# Patient Record
Sex: Female | Born: 1990 | Race: White | Hispanic: Yes | Marital: Single | Smoking: Never smoker
Health system: Southern US, Community
[De-identification: ages and names within clinical notes are randomized; demographics above are authoritative.]

## PROBLEM LIST (undated history)

## (undated) DIAGNOSIS — F419 Anxiety disorder, unspecified: Secondary | ICD-10-CM

## (undated) DIAGNOSIS — D649 Anemia, unspecified: Secondary | ICD-10-CM

## (undated) DIAGNOSIS — E785 Hyperlipidemia, unspecified: Secondary | ICD-10-CM

## (undated) HISTORY — DX: Anxiety disorder, unspecified: F41.9

## (undated) HISTORY — DX: Hyperlipidemia, unspecified: E78.5

## (undated) HISTORY — DX: Anemia, unspecified: D64.9

## (undated) HISTORY — PX: NO PAST SURGERIES: SHX2092

---

## 2018-07-03 LAB — HIV ANTIBODY (ROUTINE TESTING W REFLEX): HIV 1&2 Ab, 4th Generation: NONREACTIVE

## 2018-07-09 LAB — HM PAP SMEAR

## 2019-12-09 ENCOUNTER — Encounter: Payer: Self-pay | Admitting: Family Medicine

## 2019-12-09 ENCOUNTER — Other Ambulatory Visit: Payer: Self-pay

## 2019-12-09 ENCOUNTER — Ambulatory Visit (INDEPENDENT_AMBULATORY_CARE_PROVIDER_SITE_OTHER): Payer: 59 | Admitting: Family Medicine

## 2019-12-09 VITALS — BP 106/58 | HR 92 | Ht 63.75 in | Wt 152.4 lb

## 2019-12-09 DIAGNOSIS — R5383 Other fatigue: Secondary | ICD-10-CM | POA: Diagnosis not present

## 2019-12-09 DIAGNOSIS — F419 Anxiety disorder, unspecified: Secondary | ICD-10-CM

## 2019-12-09 DIAGNOSIS — E7800 Pure hypercholesterolemia, unspecified: Secondary | ICD-10-CM

## 2019-12-09 DIAGNOSIS — K5900 Constipation, unspecified: Secondary | ICD-10-CM

## 2019-12-09 DIAGNOSIS — D649 Anemia, unspecified: Secondary | ICD-10-CM | POA: Diagnosis not present

## 2019-12-09 DIAGNOSIS — I341 Nonrheumatic mitral (valve) prolapse: Secondary | ICD-10-CM

## 2019-12-09 DIAGNOSIS — Z7689 Persons encountering health services in other specified circumstances: Secondary | ICD-10-CM | POA: Insufficient documentation

## 2019-12-09 DIAGNOSIS — K649 Unspecified hemorrhoids: Secondary | ICD-10-CM | POA: Insufficient documentation

## 2019-12-09 NOTE — Progress Notes (Signed)
Subjective:    Patient ID: Wendy Mora, female    DOB: [birth year], 29 y.o.   MRN: [id number]  Wendy Mora is a 29 y.o. female presenting on 12/09/2019 for Establish Care and Fatigue   HPI  Wendy Mora presents to clinic as a new patient to establish for primary care.  Previous PCP was at [facility] in [place], [place].  Records will be requested.  Past medical, family, and surgical history reviewed w/ pt.  Wendy Mora has acute concerns for referral to gastroenterology for hemorrhoids and cardiology to establish for her history of MVP.  Has concerns for fatigue that has been present for approximately 3 weeks, was diagnosed with anemia approximately 5 months ago and took an iron supplement for approximately 30 days.  Has not had repeat labs drawn but reports has been working towards increasing her dietary iron intake.  No flowsheet data found.  Social History   Tobacco Use  . Smoking status: Former Smoker    Years: 0.50    Quit date: 06/08/2019    Years since quitting: 0.5  . Smokeless tobacco: Never Used  . Tobacco comment: social smoker   Vaping Use  . Vaping Use: Never used  Substance Use Topics  . Alcohol use: Yes    Comment: socially  . Drug use: Never    Review of Systems  Constitutional: Positive for fatigue. Negative for activity change, appetite change, chills, diaphoresis, fever and unexpected weight change.  HENT: Negative.   Eyes: Negative.   Respiratory: Negative.   Cardiovascular: Negative.   Gastrointestinal: Positive for constipation. Negative for abdominal distention, abdominal pain, diarrhea, nausea, rectal pain and vomiting.       Blood on toilet tissue at times  Endocrine: Negative.   Genitourinary: Negative.   Musculoskeletal: Negative.   Skin: Negative.   Allergic/Immunologic: Negative.   Neurological: Negative.   Hematological: Negative.   Psychiatric/Behavioral: Negative.    Per HPI unless specifically indicated  above     Objective:    BP (!) 106/58 (BP Location: Right Arm, Patient Position: Sitting, Cuff Size: Normal)   Pulse 92   Ht 5' 3.75" (1.619 m)   Wt 152 lb 6.4 oz (69.1 kg)   LMP 11/19/2019   BMI 26.36 kg/m   Wt Readings from Last 3 Encounters:  12/09/19 152 lb 6.4 oz (69.1 kg)    Physical Exam Vitals and nursing note reviewed.  Constitutional:      General: She is not in acute distress.    Appearance: Normal appearance. She is well-developed, well-groomed and overweight. She is not ill-appearing or toxic-appearing.  HENT:     Head: Normocephalic and atraumatic.     Nose:     Comments: Lesia Sago is in place, covering mouth and nose. Eyes:     General: Lids are normal. Vision grossly intact.        Right eye: No discharge.        Left eye: No discharge.     Extraocular Movements: Extraocular movements intact.     Conjunctiva/sclera: Conjunctivae normal.     Pupils: Pupils are equal, round, and reactive to light.  Cardiovascular:     Rate and Rhythm: Normal rate and regular rhythm.     Pulses: Normal pulses.     Heart sounds: Normal heart sounds. No murmur heard.  No friction rub. No gallop.   Pulmonary:     Effort: Pulmonary effort is normal. No respiratory distress.     Breath sounds: Normal breath sounds.  Skin:    General: Skin is warm and dry.     Capillary Refill: Capillary refill takes less than 2 seconds.  Neurological:     General: No focal deficit present.     Mental Status: She is alert and oriented to person, place, and time.  Psychiatric:        Attention and Perception: Attention and perception normal.        Mood and Affect: Mood and affect normal.        Speech: Speech normal.        Behavior: Behavior normal. Behavior is cooperative.        Thought Content: Thought content normal.        Cognition and Memory: Cognition and memory normal.        Judgment: Judgment normal.    No results found for this or any previous visit.    Assessment & Plan:    Problem List Items Addressed This Visit      Cardiovascular and Mediastinum   Mitral valve prolapse    Reports previously managed with cardiology in [place], [place], records not available but are requested at today's visit.  States was previously prescribed a beta-blocker but has not taken this in the past, has had a stress test and echo, reports not available at time of visit.  Reports father passed suddenly in his sleep 06/2019, believes to be cardiac in origin.  Requesting referral to cardiology to establish locally.  Referral to cardiology placed.      Relevant Orders   Ambulatory referral to Cardiology   Hemorrhoids    Hx of hemorrhoids with constipation and straining leading to blood on toilet tissue.  Reports previous PCP in past had referred her to GI in [place] but moved prior to completing that appointment.  Reports no improvement with increase in water intake or fiber intake.  Is interested in referral to local GI provider to establish.  Referral placed to GI.      Relevant Orders   Ambulatory referral to Gastroenterology     Other   Anemia    Status unknown.  Recheck labs.  Followup after labs.       Relevant Orders   CBC with Differential   Fatigue    Hx of anemia, last labs unknown.  With hemorrhoids and blood on toilet tissue.  Will have repeat labs drawn for evaluation of H/H and for TSH.  Labs to be drawn in the next 1-2 weeks.      Relevant Orders   CBC with Differential   COMPLETE METABOLIC PANEL WITH GFR   TSH + free T4   Anxiety    Reports history of social anxiety, well controlled at the present moment.  No acute concerns.      Encounter to establish care with new doctor - Primary    New patient establishment at [facility] for primary care.  Previous PCP with [facility] in [place], [place].  Records to be requested.  Will schedule for CPE in 3-6 months.      Constipation    See hemorrhoids A/P      Relevant Orders   Ambulatory  referral to Gastroenterology   Elevated LDL cholesterol level    Reports history of elevated LDL in the past.  Has been working on dietary and lifestyle modifications.  No labs for review, previous medical records have been requested.         No orders of the defined types were placed in this  encounter.  Follow up plan: Return in about 6 months (around 06/07/2020) for CPE.   Charlaine Dalton, FNP Family Nurse Practitioner [facility] 12/09/2019, 3:23 PM

## 2019-12-09 NOTE — Assessment & Plan Note (Addendum)
Hx of hemorrhoids with constipation and straining leading to blood on toilet tissue.  Reports previous PCP in past had referred her to GI in [place] but moved prior to completing that appointment.  Reports no improvement with increase in water intake or fiber intake.  Is interested in referral to local GI provider to establish.  Referral placed to GI.

## 2019-12-09 NOTE — Assessment & Plan Note (Signed)
Reports previously managed with cardiology in [place], [place], records not available but are requested at today's visit.  States was previously prescribed a beta-blocker but has not taken this in the past, has had a stress test and echo, reports not available at time of visit.  Reports father passed suddenly in his sleep 06/2019, believes to be cardiac in origin.  Requesting referral to cardiology to establish locally.  Referral to cardiology placed.

## 2019-12-09 NOTE — Assessment & Plan Note (Signed)
Hx of anemia, last labs unknown.  With hemorrhoids and blood on toilet tissue.  Will have repeat labs drawn for evaluation of H/H and for TSH.  Labs to be drawn in the next 1-2 weeks.

## 2019-12-09 NOTE — Assessment & Plan Note (Signed)
Reports history of social anxiety, well controlled at the present moment.  No acute concerns.

## 2019-12-09 NOTE — Assessment & Plan Note (Signed)
Status unknown.  Recheck labs.  Followup after labs.  

## 2019-12-09 NOTE — Patient Instructions (Signed)
Have your labs drawn and we will contact you with the results.  A referral to Cardiology has been placed today.  If you have not heard from the specialty office or our referral coordinator within 1 week, please let us know and we will follow up with the referral coordinator for an update.  A referral to Gastroenterology has been placed today.  If you have not heard from the specialty office or our referral coordinator within 1 week, please let us know and we will follow up with the referral coordinator for an update.  We will plan to see you back in 3-6 months for your physical  You will receive a survey after today's visit either digitally by e-mail or paper by USPS mail. Your experiences and feedback matter to [place].  Please respond so we know how we are doing as we provide care for you.  Call us with any questions/concerns/needs.  It is my goal to be available to you for your health concerns.  Thanks for choosing me to be a partner in your healthcare needs!  Charlaine Dalton, FNP-C Family Nurse Practitioner [facility] Phone: [phone]

## 2019-12-09 NOTE — Assessment & Plan Note (Signed)
See hemorrhoids A/P

## 2019-12-09 NOTE — Assessment & Plan Note (Signed)
New patient establishment at [facility] for primary care.  Previous PCP with [facility] in [place], [place].  Records to be requested.  Will schedule for CPE in 3-6 months.

## 2019-12-09 NOTE — Assessment & Plan Note (Signed)
Reports history of elevated LDL in the past.  Has been working on dietary and lifestyle modifications.  No labs for review, previous medical records have been requested.

## 2019-12-10 ENCOUNTER — Telehealth: Payer: Self-pay

## 2019-12-10 NOTE — Telephone Encounter (Signed)
Copied from CRM [id number]. Topic: Appointment Scheduling - Scheduling Inquiry for Clinic >> Dec 10, 2019  8:51 AM Leafy Ro wrote: Reason for CRM: Pt is calling and was seen yesterday and did not have blood work. Pt is calling to see if she needs to come back to office for blood work or have blood work drawn in 1-2 wks. Please confirm

## 2019-12-10 NOTE — Telephone Encounter (Signed)
In your note you recommended that the patient f/u in 1-2 weeks for labwork. Appt scheduled for Dec 13th. Please order labs for the patient.

## 2019-12-10 NOTE — Telephone Encounter (Signed)
Labs ordered during yesterday's appt.  Still in chart.  TY

## 2019-12-23 ENCOUNTER — Other Ambulatory Visit: Payer: Self-pay

## 2019-12-23 ENCOUNTER — Ambulatory Visit: Payer: 59 | Admitting: Cardiology

## 2019-12-23 ENCOUNTER — Other Ambulatory Visit: Payer: 59

## 2019-12-23 ENCOUNTER — Encounter: Payer: Self-pay | Admitting: Cardiology

## 2019-12-23 VITALS — BP 110/70 | HR 73 | Ht 65.5 in | Wt 148.1 lb

## 2019-12-23 DIAGNOSIS — E7800 Pure hypercholesterolemia, unspecified: Secondary | ICD-10-CM

## 2019-12-23 DIAGNOSIS — I341 Nonrheumatic mitral (valve) prolapse: Secondary | ICD-10-CM | POA: Diagnosis not present

## 2019-12-23 NOTE — Patient Instructions (Signed)
We will have you sign a release for your records (stress test, echo) from doctor in [place] if you haven't already.  Medication Instructions:  Your physician recommends that you continue on your current medications as directed. Please refer to the Current Medication list given to you today.  *If you need a refill on your cardiac medications before your next appointment, please call your pharmacy*  Lab Work: None  If you have labs (blood work) drawn today and your tests are completely normal, you will receive your results only by: Marland Kitchen MyChart Message (if you have MyChart) OR . A paper copy in the mail If you have any lab test that is abnormal or we need to change your treatment, we will call you to review the results.   Testing/Procedures: Your physician has requested that you have an echocardiogram. Echocardiography is a painless test that uses sound waves to create images of your heart. It provides your doctor with information about the size and shape of your heart and how well your heart's chambers and valves are working. This procedure takes approximately one hour. There are no restrictions for this procedure. There is a possibility that an IV may need to be started during your test to inject an image enhancing agent. This is done to obtain more optimal pictures of your heart. Therefore we ask that you do at least drink some water prior to coming in to hydrate your veins.    Follow-Up: At [facility], you and your health needs are our priority.  As part of our continuing mission to provide you with exceptional heart care, we have created designated Provider Care Teams.  These Care Teams include your primary Cardiologist (physician) and Advanced Practice Providers (APPs -  Physician Assistants and Nurse Practitioners) who all work together to provide you with the care you need, when you need it.  We recommend signing up for the patient portal called "MyChart".  Sign up information is  provided on this After Visit Summary.  MyChart is used to connect with patients for Virtual Visits (Telemedicine).  Patients are able to view lab/test results, encounter notes, upcoming appointments, etc.  Non-urgent messages can be sent to your provider as well.   To learn more about what you can do with MyChart, go to [email or web address].    Your next appointment:   After echo.   The format for your next appointment:   In Person  Provider:   You may see Dr. Azucena Cecil or one of the following Advanced Practice Providers on your designated Care Team:    Nicolasa Ducking, NP  Eula Listen, PA-C  Marisue Ivan, PA-C  Cadence Cordova, [place]  Gillian Shields, NP

## 2019-12-23 NOTE — Progress Notes (Signed)
Cardiology Office Note:    Date:  12/23/2019   ID:  Wendy Mora, DOB [birth year], MRN [id number]  PCP:  Tarri Fuller, FNP  [facility] Cardiologist:  No primary care provider on file.  [facility] Electrophysiologist:  None   Referring MD: Tarri Fuller, FNP   Chief Complaint  Patient presents with  . OTHER    Hx Mitral valve prolapse no complaints today. Meds reviewed verbally with pt.   Wendy Mora is a 29 y.o. female who is being seen today for the evaluation of mitral valve prolapse at the request of Malfi, Jodelle Gross, FNP.   History of Present Illness:    Wendy Mora is a 29 y.o. female with a hx of mitral valve prolapse who presents to establish care.  Patient states being diagnosed with mitral valve prolapse 3 years ago while in [place].  She had symptoms of palpitations, was placed on a cardiac monitor but did not wear it after developing a skin reaction.  She underwent stress testing which was normal.  Also had an echocardiogram and was told she had mitral valve prolapse.  Has not followed up since.  She recently moved to the area from [place].  Denies chest pain, shortness of breath, palpitations.  Denies any other history of heart disease.  Past Medical History:  Diagnosis Date  . Anemia   . Anxiety   . Hyperlipidemia   . Mitral valve prolapse     Past Surgical History:  Procedure Laterality Date  . NO PAST SURGERIES      Current Medications: Current Meds  Medication Sig  . Specialty Vitamins Products (COLLAGEN ULTRA) CAPS Take 1 capsule by mouth.     Allergies:   Mango flavor   Social History   Socioeconomic History  . Marital status: Single    Spouse name: Not on file  . Number of children: Not on file  . Years of education: Not on file  . Highest education level: Not on file  Occupational History  . Not on file  Tobacco Use  . Smoking status: Former Smoker    Years: 0.50    Quit date: 06/08/2019    Years since  quitting: 0.5  . Smokeless tobacco: Never Used  . Tobacco comment: social smoker   Vaping Use  . Vaping Use: Never used  Substance and Sexual Activity  . Alcohol use: Yes    Comment: socially  . Drug use: Never  . Sexual activity: Not on file  Other Topics Concern  . Not on file  Social History Narrative  . Not on file   Social Determinants of Health   Financial Resource Strain: Not on file  Food Insecurity: Not on file  Transportation Needs: Not on file  Physical Activity: Not on file  Stress: Not on file  Social Connections: Not on file     Family History: The patient's family history includes Alcohol abuse in her father; Alzheimer's disease in her paternal grandmother; Bipolar disorder in her brother; Cancer in her maternal grandfather and maternal grandmother; Depression in her mother; Diabetes in her brother and mother; Heart disease in her father; High Cholesterol in her sister; Hyperlipidemia in her mother; Hypertension in her mother; Mental retardation in her brother; Obesity in her brother; Thyroid disease in her mother.  ROS:   Please see the history of present illness.     All other systems reviewed and are negative.  EKGs/Labs/Other Studies Reviewed:    The following studies were reviewed  today:   EKG:  EKG is  ordered today.  The ekg ordered today demonstrates normal sinus rhythm, sinus arrhythmia, normal ECG.  Recent Labs: No results found for requested labs within last 8760 hours.  Recent Lipid Panel No results found for: CHOL, TRIG, HDL, CHOLHDL, VLDL, LDLCALC, LDLDIRECT   Risk Assessment/Calculations:      Physical Exam:    VS:  BP 110/70 (BP Location: Left Arm, Patient Position: Sitting, Cuff Size: Normal)   Pulse 73   Ht 5' 5.5" (1.664 m)   Wt 148 lb 2 oz (67.2 kg)   SpO2 99%   BMI 24.27 kg/m     Wt Readings from Last 3 Encounters:  12/23/19 148 lb 2 oz (67.2 kg)  12/09/19 152 lb 6.4 oz (69.1 kg)     GEN:  Well nourished, well  developed in no acute distress HEENT: Normal NECK: No JVD; No carotid bruits LYMPHATICS: No lymphadenopathy CARDIAC: RRR, no murmurs, rubs, gallops RESPIRATORY:  Clear to auscultation without rales, wheezing or rhonchi  ABDOMEN: Soft, non-tender, non-distended MUSCULOSKELETAL:  No edema; No deformity  SKIN: Warm and dry NEUROLOGIC:  Alert and oriented x 3 PSYCHIATRIC:  Normal affect   ASSESSMENT:    1. Mitral valve prolapse   2. Pure hypercholesterolemia    PLAN:    In order of problems listed above:  1. Patient with history of mitral valve prolapse.  Will get echocardiogram to confirm diagnosis and also evaluate for any significant valvular pathology including regurgitation.  Obtain records from prior cardiologist for comparison. 2. Hyperlipidemia, repeat blood work being planned by PCP.  Low-cholesterol diet, exercise advised.  Follow-up after echo.    Medication Adjustments/Labs and Tests Ordered: Current medicines are reviewed at length with the patient today.  Concerns regarding medicines are outlined above.  Orders Placed This Encounter  Procedures  . EKG 12-Lead  . ECHOCARDIOGRAM COMPLETE   No orders of the defined types were placed in this encounter.   Patient Instructions  We will have you sign a release for your records (stress test, echo) from doctor in [place] if you haven't already.  Medication Instructions:  Your physician recommends that you continue on your current medications as directed. Please refer to the Current Medication list given to you today.  *If you need a refill on your cardiac medications before your next appointment, please call your pharmacy*  Lab Work: None  If you have labs (blood work) drawn today and your tests are completely normal, you will receive your results only by: Marland Kitchen MyChart Message (if you have MyChart) OR . A paper copy in the mail If you have any lab test that is abnormal or we need to change your treatment, we will call  you to review the results.   Testing/Procedures: Your physician has requested that you have an echocardiogram. Echocardiography is a painless test that uses sound waves to create images of your heart. It provides your doctor with information about the size and shape of your heart and how well your heart's chambers and valves are working. This procedure takes approximately one hour. There are no restrictions for this procedure. There is a possibility that an IV may need to be started during your test to inject an image enhancing agent. This is done to obtain more optimal pictures of your heart. Therefore we ask that you do at least drink some water prior to coming in to hydrate your veins.    Follow-Up: At [facility], you and your health needs  are our priority.  As part of our continuing mission to provide you with exceptional heart care, we have created designated Provider Care Teams.  These Care Teams include your primary Cardiologist (physician) and Advanced Practice Providers (APPs -  Physician Assistants and Nurse Practitioners) who all work together to provide you with the care you need, when you need it.  We recommend signing up for the patient portal called "MyChart".  Sign up information is provided on this After Visit Summary.  MyChart is used to connect with patients for Virtual Visits (Telemedicine).  Patients are able to view lab/test results, encounter notes, upcoming appointments, etc.  Non-urgent messages can be sent to your provider as well.   To learn more about what you can do with MyChart, go to [email or web address].    Your next appointment:   After echo.   The format for your next appointment:   In Person  Provider:   You may see Dr. Azucena Cecil or one of the following Advanced Practice Providers on your designated Care Team:    Nicolasa Ducking, NP  Eula Listen, PA-C  Marisue Ivan, PA-C  Cadence Nada, [place]  Gillian Shields, NP      Signed, Debbe Odea, MD  12/23/2019 4:44 PM    [facility]

## 2019-12-24 LAB — COMPLETE METABOLIC PANEL WITH GFR
AG Ratio: 1.5 (calc) (ref 1.0–2.5)
ALT: 11 U/L (ref 6–29)
AST: 15 U/L (ref 10–30)
Albumin: 4.3 g/dL (ref 3.6–5.1)
Alkaline phosphatase (APISO): 52 U/L (ref 31–125)
BUN: 11 mg/dL (ref 7–25)
CO2: 25 mmol/L (ref 20–32)
Calcium: 9.3 mg/dL (ref 8.6–10.2)
Chloride: 101 mmol/L (ref 98–110)
Creat: 0.68 mg/dL (ref 0.50–1.10)
GFR, Est African American: 137 mL/min/{1.73_m2} (ref >=60)
GFR, Est Non African American: 118 mL/min/{1.73_m2} (ref >=60)
Globulin: 2.8 g/dL (calc) (ref 1.9–3.7)
Glucose, Bld: 84 mg/dL (ref 65–99)
Potassium: 4 mmol/L (ref 3.5–5.3)
Sodium: 135 mmol/L (ref 135–146)
Total Bilirubin: 1.2 mg/dL (ref 0.2–1.2)
Total Protein: 7.1 g/dL (ref 6.1–8.1)

## 2019-12-24 LAB — CBC WITH DIFFERENTIAL/PLATELET
Absolute Monocytes: 544 cells/uL (ref 200–950)
Basophils Absolute: 70 cells/uL (ref 0–200)
Basophils Relative: 1.1 %
Eosinophils Absolute: 90 cells/uL (ref 15–500)
Eosinophils Relative: 1.4 %
HCT: 33.7 % — ABNORMAL LOW (ref 35.0–45.0)
Hemoglobin: 11.3 g/dL — ABNORMAL LOW (ref 11.7–15.5)
Lymphs Abs: 2144 cells/uL (ref 850–3900)
MCH: 28.9 pg (ref 27.0–33.0)
MCHC: 33.5 g/dL (ref 32.0–36.0)
MCV: 86.2 fL (ref 80.0–100.0)
MPV: 11.2 fL (ref 7.5–12.5)
Monocytes Relative: 8.5 %
Neutro Abs: 3552 cells/uL (ref 1500–7800)
Neutrophils Relative %: 55.5 %
Platelets: 301 10*3/uL (ref 140–400)
RBC: 3.91 10*6/uL (ref 3.80–5.10)
RDW: 12.8 % (ref 11.0–15.0)
Total Lymphocyte: 33.5 %
WBC: 6.4 10*3/uL (ref 3.8–10.8)

## 2019-12-24 LAB — TSH+FREE T4: TSH W/REFLEX TO FT4: 1.63 mIU/L

## 2019-12-27 ENCOUNTER — Encounter: Payer: Self-pay | Admitting: Family Medicine

## 2020-01-01 ENCOUNTER — Other Ambulatory Visit: Payer: Self-pay | Admitting: Family Medicine

## 2020-01-06 ENCOUNTER — Encounter: Payer: Self-pay | Admitting: Family Medicine

## 2020-01-23 ENCOUNTER — Ambulatory Visit (INDEPENDENT_AMBULATORY_CARE_PROVIDER_SITE_OTHER): Payer: 59

## 2020-01-23 ENCOUNTER — Other Ambulatory Visit: Payer: Self-pay

## 2020-01-23 DIAGNOSIS — I341 Nonrheumatic mitral (valve) prolapse: Secondary | ICD-10-CM | POA: Diagnosis not present

## 2020-01-23 LAB — ECHOCARDIOGRAM COMPLETE
Area-P 1/2: 4.08 cm2
S' Lateral: 3.2 cm

## 2020-01-24 ENCOUNTER — Encounter: Payer: Self-pay | Admitting: Gastroenterology

## 2020-01-24 ENCOUNTER — Other Ambulatory Visit: Payer: Self-pay | Admitting: Gastroenterology

## 2020-01-24 ENCOUNTER — Other Ambulatory Visit: Payer: Self-pay

## 2020-01-24 ENCOUNTER — Ambulatory Visit: Payer: 59 | Admitting: Gastroenterology

## 2020-01-24 VITALS — BP 116/81 | HR 99 | Temp 97.8°F | Ht 65.5 in | Wt 148.5 lb

## 2020-01-24 DIAGNOSIS — K625 Hemorrhage of anus and rectum: Secondary | ICD-10-CM

## 2020-01-24 DIAGNOSIS — D649 Anemia, unspecified: Secondary | ICD-10-CM

## 2020-01-24 DIAGNOSIS — K602 Anal fissure, unspecified: Secondary | ICD-10-CM

## 2020-01-24 MED ORDER — NA SULFATE-K SULFATE-MG SULF 17.5-3.13-1.6 GM/177ML PO SOLN: 354.0000 mL | Freq: Once | ORAL | 0 refills | Status: DC

## 2020-01-24 MED ORDER — SUTAB 1479-225-188 MG PO TABS: 12.0000 | ORAL_TABLET | Freq: Two times a day (BID) | ORAL | 0 refills | Status: DC

## 2020-01-24 NOTE — Progress Notes (Signed)
Arlyss Repress, MD [address]  [address]  [place], [place] [address]  Main: [phone]  Fax: [phone]    Gastroenterology Consultation  Referring Provider:     Tarri Fuller, FNP Primary Care Physician:  Tarri Fuller, FNP Primary Gastroenterologist:  Dr. Arlyss Repress Reason for Consultation:     Rectal bleeding, rectal pain        HPI:   Wendy Mora is a 30 y.o. female referred by Dr. Anitra Lauth, Jodelle Gross, FNP  for consultation & management of rectal bleeding, rectal pain.  Patient reports approximately 3 months history of intermittent bright red blood per rectum, usually on wiping associated with severe rectal pain, itching, swelling.  These episodes occur about once a month and last for 1 week at a time.  Usually coincides with episodes of constipation.  Patient reports that she has been dealing with constipation for few years, moved from [place] to [place] in July last year.  She is managing to eat more fiber, incorporate more water as well as trying to exercise.  She is working from home, sitting at one place 8 to 10 hours daily.  She does report severe tearing type of rectal pain in the posterior wall of the anal canal during the bowel movement and she has flared up and she thinks she may have a tear.  Currently, she does not have any symptoms  Patient is found to have normocytic anemia based on labs 12/2019  NSAIDs: None  Antiplts/Anticoagulants/Anti thrombotics: None  GI Procedures: None She denies family history of known GI malignancy  Past Medical History:  Diagnosis Date  . Anemia   . Anxiety   . Hyperlipidemia   . Mitral valve prolapse     Past Surgical History:  Procedure Laterality Date  . NO PAST SURGERIES      Current Outpatient Medications:  .  Sodium Sulfate-Mag Sulfate-KCl (SUTAB) [phone] MG TABS, Take 12 tablets by mouth in the morning and at bedtime. At 5pm take 12 tablets and then 5 hours prior to colonoscopy take the  other 12., Disp: 24 tablet, Rfl: 0 .  Specialty Vitamins Products (COLLAGEN ULTRA) CAPS, Take 1 capsule by mouth., Disp: , Rfl:  .  Na Sulfate-K Sulfate-Mg Sulf 17.5-3.13-1.6 GM/177ML SOLN, Take 354 mLs by mouth once for 1 dose., Disp: 354 mL, Rfl: 0   Family History  Problem Relation Age of Onset  . Diabetes Mother   . Depression Mother   . Thyroid disease Mother   . Hyperlipidemia Mother   . Hypertension Mother   . Heart disease Father   . Alcohol abuse Father   . Diabetes Brother   . Mental retardation Brother   . Cancer Maternal Grandmother   . Cancer Maternal Grandfather   . Alzheimer's disease Paternal Grandmother   . Obesity Brother   . Bipolar disorder Brother   . High Cholesterol Sister      Social History   Tobacco Use  . Smoking status: Former Smoker    Years: 0.50    Quit date: 06/08/2019    Years since quitting: 0.6  . Smokeless tobacco: Never Used  . Tobacco comment: social smoker   Vaping Use  . Vaping Use: Never used  Substance Use Topics  . Alcohol use: Yes    Comment: socially  . Drug use: Never    Allergies as of 01/24/2020 - Review Complete 01/24/2020  Allergen Reaction Noted  . Mango flavor Rash 12/09/2019    Review  of Systems:    All systems reviewed and negative except where noted in HPI.   Physical Exam:  BP 116/81 (BP Location: Left Arm, Patient Position: Sitting, Cuff Size: Normal)   Pulse 99   Temp 97.8 F (36.6 C) (Oral)   Ht 5' 5.5" (1.664 m)   Wt 148 lb 8 oz (67.4 kg)   BMI 24.34 kg/m  No LMP recorded.  General:   Alert,  Well-developed, well-nourished, pleasant and cooperative in NAD Head:  Normocephalic and atraumatic. Eyes:  Sclera clear, no icterus.   Conjunctiva pink. Ears:  Normal auditory acuity. Nose:  No deformity, discharge, or lesions. Mouth:  No deformity or lesions,oropharynx pink & moist. Neck:  Supple; no masses or thyromegaly. Lungs:  Respirations even and unlabored.  Clear throughout to auscultation.   No  wheezes, crackles, or rhonchi. No acute distress. Heart:  Regular rate and rhythm; no murmurs, clicks, rubs, or gallops. Abdomen:  Normal bowel sounds. Soft, non-tender and non-distended without masses, hepatosplenomegaly or hernias noted.  No guarding or rebound tenderness.   Rectal: Normal, non tender, normal perianal skin Msk:  Symmetrical without gross deformities. Good, equal movement & strength bilaterally. Pulses:  Normal pulses noted. Extremities:  No clubbing or edema.  No cyanosis. Neurologic:  Alert and oriented x3;  grossly normal neurologically. Skin:  Intact without significant lesions or rashes. No jaundice. Psych:  Alert and cooperative. Normal mood and affect.  Imaging Studies: none  Assessment and Plan:   Wendy Mora is a 30 y.o. female with no significant past medical history is seen in consultation for chronic constipation and rectal bleeding associated with rectal pain, itching and swelling, history of normocytic anemia  Chronic constipation Discussed about high-fiber diet, information provided Adequate intake of water Discussed about fiber supplements and stool softeners such as fiber choice, Metamucil, Benefiber and MiraLAX if needed  Rectal bleeding Recommend colonoscopy for further evaluation  Rectal pain Likely anal fissure and symptomatic external hemorrhoids Recommend to start 0.125% nitroglycerin with 5% lidocaine to treat anal fissure, and instructions provided  Normocytic anemia Recheck CBC, iron panel and B12 folate panel   Follow up in 2 months   Arlyss Repress, MD

## 2020-01-24 NOTE — Patient Instructions (Addendum)
[facility] [address], [place] [address].   Phone number [phone].   Please allow at least 24 hours before picking up the compounded cream because the pharmacy has to make the medication.  

## 2020-01-25 LAB — IRON,TIBC AND FERRITIN PANEL
Ferritin: 27 ng/mL (ref 15–150)
Iron Saturation: 45 % (ref 15–55)
Iron: 167 ug/dL — ABNORMAL HIGH (ref 27–159)
Total Iron Binding Capacity: 371 ug/dL (ref 250–450)
UIBC: 204 ug/dL (ref 131–425)

## 2020-01-25 LAB — CBC
Hematocrit: 35.2 % (ref 34.0–46.6)
Hemoglobin: 11.6 g/dL (ref 11.1–15.9)
MCH: 28.2 pg (ref 26.6–33.0)
MCHC: 33 g/dL (ref 31.5–35.7)
MCV: 86 fL (ref 79–97)
Platelets: 341 10*3/uL (ref 150–450)
RBC: 4.11 x10E6/uL (ref 3.77–5.28)
RDW: 13.1 % (ref 11.7–15.4)
WBC: 6 10*3/uL (ref 3.4–10.8)

## 2020-01-25 LAB — B12 AND FOLATE PANEL
Folate: 9.5 ng/mL (ref >3.0)
Vitamin B-12: 415 pg/mL (ref 232–1245)

## 2020-01-27 ENCOUNTER — Ambulatory Visit: Payer: 59 | Admitting: Cardiology

## 2020-01-28 ENCOUNTER — Encounter: Payer: Self-pay | Admitting: Cardiology

## 2020-01-28 ENCOUNTER — Telehealth: Payer: Self-pay | Admitting: Cardiology

## 2020-01-28 ENCOUNTER — Telehealth (INDEPENDENT_AMBULATORY_CARE_PROVIDER_SITE_OTHER): Payer: 59 | Admitting: Cardiology

## 2020-01-28 VITALS — BP 124/61 | HR 69 | Ht 65.0 in | Wt 148.0 lb

## 2020-01-28 DIAGNOSIS — E7800 Pure hypercholesterolemia, unspecified: Secondary | ICD-10-CM | POA: Diagnosis not present

## 2020-01-28 DIAGNOSIS — Z8679 Personal history of other diseases of the circulatory system: Secondary | ICD-10-CM

## 2020-01-28 NOTE — Patient Instructions (Signed)

## 2020-01-28 NOTE — Progress Notes (Signed)
Virtual Visit via Video Note   This visit type was conducted due to national recommendations for restrictions regarding the COVID-19 Pandemic (e.g. social distancing) in an effort to limit this patient's exposure and mitigate transmission in our community.  Due to her co-morbid illnesses, this patient is at least at moderate risk for complications without adequate follow up.  This format is felt to be most appropriate for this patient at this time.  All issues noted in this document were discussed and addressed.  A limited physical exam was performed with this format.  Please refer to the patient's chart for her consent to telehealth for [facility].      Date:  01/28/2020   ID:  Wendy Mora, DOB 25-Jul-1990, MRN [id number]  Patient Location: Home Provider Location: Office/Clinic  PCP:  Tarri Fuller, FNP  Cardiologist:  Debbe Odea, MD  Electrophysiologist:  None   Evaluation Performed:  Follow-Up Visit  Chief Complaint: Follow-up visit  History of Present Illness:    Wendy Mora is a 30 y.o. female with history of hyperlipidemia, who presents for follow-up.  Previously seen due to having a history of mitral valve prolapse.  Echocardiogram was obtained to evaluate mitral valve anatomy and any pathology.  She denies any symptoms of chest pain, palpitations.  Has no concerns at this time.  Will like to discuss echocardiogram results.  The patient does not have symptoms concerning for COVID-19 infection (fever, chills, cough, or new shortness of breath).    Past Medical History:  Diagnosis Date  . Anemia   . Anxiety   . Hyperlipidemia    Past Surgical History:  Procedure Laterality Date  . NO PAST SURGERIES       Current Meds  Medication Sig  . ferrous sulfate 325 (65 FE) MG EC tablet Take 325 mg by mouth daily with breakfast.  . Specialty Vitamins Products (COLLAGEN ULTRA) CAPS Take 1 capsule by mouth daily as needed.     Allergies:   Mango  flavor   Social History   Tobacco Use  . Smoking status: Former Smoker    Years: 0.50    Quit date: 06/08/2019    Years since quitting: 0.6  . Smokeless tobacco: Never Used  . Tobacco comment: social smoker   Vaping Use  . Vaping Use: Never used  Substance Use Topics  . Alcohol use: Yes    Comment: socially  . Drug use: Never     Family Hx: The patient's family history includes Alcohol abuse in her father; Alzheimer's disease in her paternal grandmother; Bipolar disorder in her brother; Cancer in her maternal grandfather and maternal grandmother; Depression in her mother; Diabetes in her brother and mother; Heart disease in her father; High Cholesterol in her sister; Hyperlipidemia in her mother; Hypertension in her mother; Mental retardation in her brother; Obesity in her brother; Thyroid disease in her mother.  ROS:   Please see the history of present illness.     All other systems reviewed and are negative.   Prior CV studies:   The following studies were reviewed today:    Labs/Other Tests and Data Reviewed:    EKG:  No ECG reviewed.  Recent Labs: 12/23/2019: ALT 11; BUN 11; Creat 0.68; Potassium 4.0; Sodium 135 01/24/2020: Hemoglobin 11.6; Platelets 341   Recent Lipid Panel No results found for: CHOL, TRIG, HDL, CHOLHDL, LDLCALC, LDLDIRECT  Wt Readings from Last 3 Encounters:  01/28/20 148 lb (67.1 kg)  01/24/20 148 lb 8 oz (67.4 kg)  12/23/19 148 lb 2 oz (67.2 kg)     Objective:    Vital Signs:  BP 124/61 (BP Location: Left Arm, Patient Position: Sitting, Cuff Size: Normal)   Pulse 69   Ht 5\' 5"  (1.651 m)   Wt 148 lb (67.1 kg)   BMI 24.63 kg/m    VITAL SIGNS:  reviewed GEN:  no acute distress EYES:  sclerae anicteric, EOMI - Extraocular Movements Intact  ASSESSMENT & PLAN:    1. Reported history of mitral valve prolapse.  Echocardiogram reviewed by myself with no evidence of mitral valve prolapse.  Normal systolic and diastolic function.  Results  made aware to patient and reassured.  Previous diagnosis of mitral valve prolapse taken off the chart. 2. History of hyperlipidemia, low-cholesterol diet advised.  Management as per primary care provider.  Follow-up as needed  COVID-19 Education: The signs and symptoms of COVID-19 were discussed with the patient and how to seek care for testing (follow up with PCP or arrange E-visit).  The importance of social distancing was discussed today.  Time:   Today, I have spent 35 minutes with the patient with telehealth technology discussing the above problems.     Medication Adjustments/Labs and Tests Ordered: Current medicines are reviewed at length with the patient today.  Concerns regarding medicines are outlined above.   Tests Ordered: No orders of the defined types were placed in this encounter.   Medication Changes: No orders of the defined types were placed in this encounter.   Follow Up:  In Person prn  Signed, , MD  01/28/2020 4:08 PM    [facility]

## 2020-01-28 NOTE — Telephone Encounter (Signed)
  Patient Consent for Virtual Visit         Wendy Mora has provided verbal consent on 01/28/2020 for a virtual visit (video or telephone).   CONSENT FOR VIRTUAL VISIT FOR:  Wendy Mora  By participating in this virtual visit I agree to the following:  I hereby voluntarily request, consent and authorize [facility] and its employed or contracted physicians, physician assistants, nurse practitioners or other licensed health care professionals (the Practitioner), to provide me with telemedicine health care services (the "Services") as deemed necessary by the treating Practitioner. I acknowledge and consent to receive the Services by the Practitioner via telemedicine. I understand that the telemedicine visit will involve communicating with the Practitioner through live audiovisual communication technology and the disclosure of certain medical information by electronic transmission. I acknowledge that I have been given the opportunity to request an in-person assessment or other available alternative prior to the telemedicine visit and am voluntarily participating in the telemedicine visit.  I understand that I have the right to withhold or withdraw my consent to the use of telemedicine in the course of my care at any time, without affecting my right to future care or treatment, and that the Practitioner or I may terminate the telemedicine visit at any time. I understand that I have the right to inspect all information obtained and/or recorded in the course of the telemedicine visit and may receive copies of available information for a reasonable fee.  I understand that some of the potential risks of receiving the Services via telemedicine include:  Marland Kitchen Delay or interruption in medical evaluation due to technological equipment failure or disruption; . Information transmitted may not be sufficient (e.g. poor resolution of images) to allow for appropriate medical decision making by the  Practitioner; and/or  . In rare instances, security protocols could fail, causing a breach of personal health information.  Furthermore, I acknowledge that it is my responsibility to provide information about my medical history, conditions and care that is complete and accurate to the best of my ability. I acknowledge that Practitioner's advice, recommendations, and/or decision may be based on factors not within their control, such as incomplete or inaccurate data provided by me or distortions of diagnostic images or specimens that may result from electronic transmissions. I understand that the practice of medicine is not an exact science and that Practitioner makes no warranties or guarantees regarding treatment outcomes. I acknowledge that a copy of this consent can be made available to me via my patient portal [facility] MyChart), or I can request a printed copy by calling the office of [facility].    I understand that my insurance will be billed for this visit.   I have read or had this consent read to me. . I understand the contents of this consent, which adequately explains the benefits and risks of the Services being provided via telemedicine.  . I have been provided ample opportunity to ask questions regarding this consent and the Services and have had my questions answered to my satisfaction. . I give my informed consent for the services to be provided through the use of telemedicine in my medical care

## 2020-02-10 ENCOUNTER — Encounter: Payer: Self-pay | Admitting: Gastroenterology

## 2020-02-13 ENCOUNTER — Other Ambulatory Visit: Payer: Self-pay

## 2020-02-13 ENCOUNTER — Other Ambulatory Visit
Admission: RE | Admit: 2020-02-13 | Discharge: 2020-02-13 | Disposition: A | Discharge status: Home / Self Care | Payer: 59 | Source: Ambulatory Visit | Attending: Gastroenterology | Admitting: Gastroenterology

## 2020-02-13 DIAGNOSIS — Z01812 Encounter for preprocedural laboratory examination: Secondary | ICD-10-CM | POA: Diagnosis not present

## 2020-02-13 DIAGNOSIS — Z20822 Contact with and (suspected) exposure to covid-19: Secondary | ICD-10-CM | POA: Insufficient documentation

## 2020-02-13 LAB — SARS CORONAVIRUS 2 (TAT 6-24 HRS): SARS Coronavirus 2: NEGATIVE

## 2020-02-17 ENCOUNTER — Ambulatory Visit
Admission: RE | Admit: 2020-02-17 | Discharge: 2020-02-17 | Disposition: A | Discharge status: Home / Self Care | Payer: 59 | Attending: Gastroenterology | Admitting: Gastroenterology

## 2020-02-17 ENCOUNTER — Ambulatory Visit: Payer: 59 | Admitting: Anesthesiology

## 2020-02-17 ENCOUNTER — Encounter
Admission: RE | Disposition: A | Discharge status: Home / Self Care | Payer: Self-pay | Source: Home / Self Care | Attending: Gastroenterology

## 2020-02-17 ENCOUNTER — Encounter: Payer: Self-pay | Admitting: Gastroenterology

## 2020-02-17 ENCOUNTER — Other Ambulatory Visit: Payer: Self-pay

## 2020-02-17 DIAGNOSIS — Z91018 Allergy to other foods: Secondary | ICD-10-CM | POA: Diagnosis not present

## 2020-02-17 DIAGNOSIS — Z79899 Other long term (current) drug therapy: Secondary | ICD-10-CM | POA: Diagnosis not present

## 2020-02-17 DIAGNOSIS — K625 Hemorrhage of anus and rectum: Secondary | ICD-10-CM

## 2020-02-17 DIAGNOSIS — Z87891 Personal history of nicotine dependence: Secondary | ICD-10-CM | POA: Insufficient documentation

## 2020-02-17 DIAGNOSIS — Z8349 Family history of other endocrine, nutritional and metabolic diseases: Secondary | ICD-10-CM | POA: Insufficient documentation

## 2020-02-17 DIAGNOSIS — Z8249 Family history of ischemic heart disease and other diseases of the circulatory system: Secondary | ICD-10-CM | POA: Diagnosis not present

## 2020-02-17 DIAGNOSIS — Z833 Family history of diabetes mellitus: Secondary | ICD-10-CM | POA: Diagnosis not present

## 2020-02-17 DIAGNOSIS — Z809 Family history of malignant neoplasm, unspecified: Secondary | ICD-10-CM | POA: Insufficient documentation

## 2020-02-17 HISTORY — PX: COLONOSCOPY WITH PROPOFOL: SHX5780

## 2020-02-17 LAB — POCT PREGNANCY, URINE: Preg Test, Ur: NEGATIVE

## 2020-02-17 SURGERY — COLONOSCOPY WITH PROPOFOL
Anesthesia: General

## 2020-02-17 MED ORDER — PROPOFOL 500 MG/50ML IV EMUL
INTRAVENOUS | Status: AC
  Filled 2020-02-17: qty 50

## 2020-02-17 MED ORDER — MIDAZOLAM HCL 2 MG/2ML IJ SOLN
INTRAMUSCULAR | Status: DC | PRN
  Administered 2020-02-17: 1 mg via INTRAVENOUS

## 2020-02-17 MED ORDER — MIDAZOLAM HCL 2 MG/2ML IJ SOLN
INTRAMUSCULAR | Status: AC
  Filled 2020-02-17: qty 2

## 2020-02-17 MED ORDER — PROPOFOL 10 MG/ML IV BOLUS
INTRAVENOUS | Status: DC | PRN
  Administered 2020-02-17: 40 mg via INTRAVENOUS
  Administered 2020-02-17 (×2): 30 mg via INTRAVENOUS

## 2020-02-17 MED ORDER — SODIUM CHLORIDE 0.9 % IV SOLN
INTRAVENOUS | Status: DC
  Administered 2020-02-17: 1000 mL via INTRAVENOUS

## 2020-02-17 MED ORDER — PROPOFOL 500 MG/50ML IV EMUL
INTRAVENOUS | Status: DC | PRN
  Administered 2020-02-17: 150 ug/kg/min via INTRAVENOUS

## 2020-02-17 MED ORDER — LIDOCAINE HCL (CARDIAC) PF 100 MG/5ML IV SOSY
PREFILLED_SYRINGE | INTRAVENOUS | Status: DC | PRN
  Administered 2020-02-17: 80 mg via INTRAVENOUS

## 2020-02-17 NOTE — H&P (Signed)
Arlyss Repress, MD [address]  [address]  [place], [place] [address]  Main: [phone]  Fax: [phone] Pager: [phone]  Primary Care Physician:  Tarri Fuller, FNP Primary Gastroenterologist:  Dr. Arlyss Repress  Pre-Procedure History & Physical: HPI:  Wendy Mora is a 30 y.o. female is here for an colonoscopy.   Past Medical History:  Diagnosis Date  . Anemia   . Anxiety   . Hyperlipidemia     Past Surgical History:  Procedure Laterality Date  . NO PAST SURGERIES      Prior to Admission medications   Medication Sig Start Date End Date Taking? Authorizing Provider  ferrous sulfate 325 (65 FE) MG EC tablet Take 325 mg by mouth daily with breakfast.   Yes [provider]  Specialty Vitamins Products (COLLAGEN ULTRA) CAPS Take 1 capsule by mouth daily as needed.   Yes [provider]  Sodium Sulfate-Mag Sulfate-KCl (SUTAB) [phone] MG TABS Take 12 tablets by mouth in the morning and at bedtime. At 5pm take 12 tablets and then 5 hours prior to colonoscopy take the other 12. Patient not taking: Reported on 01/28/2020 01/24/20   Toney Reil, MD    Allergies as of 01/24/2020 - Review Complete 01/24/2020  Allergen Reaction Noted  . Mango flavor Rash 12/09/2019    Family History  Problem Relation Age of Onset  . Diabetes Mother   . Depression Mother   . Thyroid disease Mother   . Hyperlipidemia Mother   . Hypertension Mother   . Heart disease Father   . Alcohol abuse Father   . Diabetes Brother   . Mental retardation Brother   . Cancer Maternal Grandmother   . Cancer Maternal Grandfather   . Alzheimer's disease Paternal Grandmother   . Obesity Brother   . Bipolar disorder Brother   . High Cholesterol Sister     Social History   Socioeconomic History  . Marital status: Single    Spouse name: Not on file  . Number of children: Not on file  . Years of education: Not on file  . Highest education level: Not on  file  Occupational History  . Not on file  Tobacco Use  . Smoking status: Former Smoker    Years: 0.50    Quit date: 06/08/2019    Years since quitting: 0.6  . Smokeless tobacco: Never Used  . Tobacco comment: social smoker   Vaping Use  . Vaping Use: Never used  Substance and Sexual Activity  . Alcohol use: Yes    Comment: socially  . Drug use: Never  . Sexual activity: Not on file  Other Topics Concern  . Not on file  Social History Narrative  . Not on file   Social Determinants of Health   Financial Resource Strain: Not on file  Food Insecurity: Not on file  Transportation Needs: Not on file  Physical Activity: Not on file  Stress: Not on file  Social Connections: Not on file  Intimate Partner Violence: Not on file    Review of Systems: See HPI, otherwise negative ROS  Physical Exam: BP 109/69   Pulse 70   Temp (!) 96.5 F (35.8 C) (Temporal)   Resp 16   Ht 5\' 5"  (1.651 m)   Wt 65.8 kg   SpO2 100%   BMI 24.13 kg/m  General:   Alert,  pleasant and cooperative in NAD Head:  Normocephalic and atraumatic. Neck:  Supple; no masses or thyromegaly. Lungs:  Clear throughout to auscultation.    Heart:  Regular rate and rhythm. Abdomen:  Soft, nontender and nondistended. Normal bowel sounds, without guarding, and without rebound.   Neurologic:  Alert and  oriented x4;  grossly normal neurologically.  Impression/Plan: Wendy Mora is here for an colonoscopy to be performed for rectal bleeding  Risks, benefits, limitations, and alternatives regarding  colonoscopy have been reviewed with the patient.  Questions have been answered.  All parties agreeable.   Lannette Donath, MD  02/17/2020, 10:03 AM

## 2020-02-17 NOTE — Op Note (Signed)
[facility] Gastroenterology Patient Name: Wendy Mora Procedure Date: 02/17/2020 10:06 AM MRN: [id number] Account #: [id number] Date of Birth: 1990/03/14 Admit Type: Outpatient Age: 30 Room: [facility] ENDO ROOM 1 Gender: Female Note Status: Finalized Procedure:             Colonoscopy Indications:           This is the patient's first colonoscopy, Rectal                         bleeding Providers:             Toney Reil MD, MD Medicines:             General Anesthesia Complications:         No immediate complications. Estimated blood loss: None. Procedure:             Pre-Anesthesia Assessment:                        - Prior to the procedure, a History and Physical was                         performed, and patient medications and allergies were                         reviewed. The patient is competent. The risks and                         benefits of the procedure and the sedation options and                         risks were discussed with the patient. All questions                         were answered and informed consent was obtained.                         Patient identification and proposed procedure were                         verified by the physician, the nurse, the                         anesthesiologist, the anesthetist and the technician                         in the pre-procedure area in the procedure room in the                         endoscopy suite. Mental Status Examination: alert and                         oriented. Airway Examination: normal oropharyngeal                         airway and neck mobility. Respiratory Examination:                         clear to auscultation. CV Examination: normal.  Prophylactic Antibiotics: The patient does not require                         prophylactic antibiotics. Prior Anticoagulants: The                         patient has taken no previous anticoagulant or                          antiplatelet agents. ASA Grade Assessment: II - A                         patient with mild systemic disease. After reviewing                         the risks and benefits, the patient was deemed in                         satisfactory condition to undergo the procedure. The                         anesthesia plan was to use general anesthesia.                         Immediately prior to administration of medications,                         the patient was re-assessed for adequacy to receive                         sedatives. The heart rate, respiratory rate, oxygen                         saturations, blood pressure, adequacy of pulmonary                         ventilation, and response to care were monitored                         throughout the procedure. The physical status of the                         patient was re-assessed after the procedure.                        After obtaining informed consent, the colonoscope was                         passed under direct vision. Throughout the procedure,                         the patient's blood pressure, pulse, and oxygen                         saturations were monitored continuously. The                         Colonoscope was introduced through the anus and  advanced to the the cecum, identified by appendiceal                         orifice and ileocecal valve. The colonoscopy was                         performed without difficulty. The patient tolerated                         the procedure well. The quality of the bowel                         preparation was fair. Findings:      The perianal and digital rectal examinations were normal. Pertinent       negatives include normal sphincter tone and no palpable rectal lesions.      The entire examined colon appeared normal.      The retroflexed view of the distal rectum and anal verge was normal and       showed no anal or rectal  abnormalities. Impression:            - Preparation of the colon was fair.                        - The entire examined colon is normal.                        - The distal rectum and anal verge are normal on                         retroflexion view.                        - No specimens collected. Recommendation:        - Discharge patient to home (with escort).                        - Resume previous diet today.                        - Continue present medications.                        - Return to my office as previously scheduled. Procedure Code(s):     --- Professional ---                        [phone], Colonoscopy, flexible; diagnostic, including                         collection of specimen(s) by brushing or washing, when                         performed (separate procedure) Diagnosis Code(s):     --- Professional ---                        K62.5, Hemorrhage of anus and rectum CPT copyright 2019 American Medical Association. All rights reserved. The codes documented in this report are preliminary and upon coder review may  be revised to meet current compliance  requirements. Dr. Libby Maw Toney Reil MD, MD 02/17/2020 10:32:45 AM This report has been signed electronically. Number of Addenda: 0 Note Initiated On: 02/17/2020 10:06 AM Scope Withdrawal Time: 0 hours 5 minutes 45 seconds  Total Procedure Duration: 0 hours 9 minutes 28 seconds  Estimated Blood Loss:  Estimated blood loss: none.      [facility]

## 2020-02-17 NOTE — Anesthesia Procedure Notes (Signed)
Date/Time: 02/17/2020 10:10 AM Performed by: Henrietta Hoover, CRNA Pre-anesthesia Checklist: Patient identified, Emergency Drugs available, Suction available, Patient being monitored and Timeout performed Patient Re-evaluated:Patient Re-evaluated prior to induction Oxygen Delivery Method: Nasal cannula Placement Confirmation: positive ETCO2

## 2020-02-17 NOTE — Anesthesia Preprocedure Evaluation (Signed)
Anesthesia Evaluation  Patient identified by MRN, date of birth, ID band Patient awake    Reviewed: Allergy & Precautions, NPO status , Patient's Chart, lab work & pertinent test results  History of Anesthesia Complications Negative for: history of anesthetic complications  Airway Mallampati: I  TM Distance: >3 FB Neck ROM: Full    Dental no notable dental hx. (+) Teeth Intact   Pulmonary neg pulmonary ROS, neg sleep apnea, neg COPD, Patient abstained from smoking.Not current smoker, former smoker,    Pulmonary exam normal breath sounds clear to auscultation       Cardiovascular Exercise Tolerance: Good METS(-) hypertension(-) CAD and (-) Past MI negative cardio ROS  (-) dysrhythmias  Rhythm:Regular Rate:Normal - Systolic murmurs TTE unremarkable (performed for palpitations)   Neuro/Psych PSYCHIATRIC DISORDERS Anxiety negative neurological ROS     GI/Hepatic neg GERD  ,(+)     (-) substance abuse  ,   Endo/Other  neg diabetes  Renal/GU negative Renal ROS     Musculoskeletal   Abdominal   Peds  Hematology  (+) anemia ,   Anesthesia Other Findings Past Medical History: No date: Anemia No date: Anxiety No date: Hyperlipidemia  Reproductive/Obstetrics                             Anesthesia Physical Anesthesia Plan  ASA: II  Anesthesia Plan: General   Post-op Pain Management:    Induction: Intravenous  PONV Risk Score and Plan: 3 and Ondansetron, Propofol infusion and TIVA  Airway Management Planned: Nasal Cannula  Additional Equipment: None  Intra-op Plan:   Post-operative Plan:   Informed Consent: I have reviewed the patients History and Physical, chart, labs and discussed the procedure including the risks, benefits and alternatives for the proposed anesthesia with the patient or authorized representative who has indicated his/her understanding and acceptance.      Dental advisory given  Plan Discussed with: CRNA and Surgeon  Anesthesia Plan Comments: (Discussed risks of anesthesia with patient, including possibility of difficulty with spontaneous ventilation under anesthesia necessitating airway intervention, PONV, and rare risks such as cardiac or respiratory or neurological events. Patient understands.)        Anesthesia Quick Evaluation

## 2020-02-17 NOTE — Anesthesia Postprocedure Evaluation (Signed)
Anesthesia Post Note  Patient: Wendy Mora  Procedure(s) Performed: COLONOSCOPY WITH PROPOFOL (N/A )  Patient location during evaluation: Endoscopy Anesthesia Type: General Level of consciousness: awake and alert Pain management: pain level controlled Vital Signs Assessment: post-procedure vital signs reviewed and stable Respiratory status: spontaneous breathing, nonlabored ventilation, respiratory function stable and patient connected to nasal cannula oxygen Cardiovascular status: blood pressure returned to baseline and stable Postop Assessment: no apparent nausea or vomiting Anesthetic complications: no   No complications documented.   Last Vitals:  Vitals:   02/17/20 1041 02/17/20 1051  BP: (!) 95/54 99/63  Pulse: 80 60  Resp: 19 15  Temp:  (!) 36.1 C  SpO2: 100% 100%    Last Pain:  Vitals:   02/17/20 1051  TempSrc: Temporal  PainSc: 0-No pain                 Corinda Gubler

## 2020-02-17 NOTE — Transfer of Care (Signed)
Immediate Anesthesia Transfer of Care Note  Patient: Wendy Mora  Procedure(s) Performed: COLONOSCOPY WITH PROPOFOL (N/A )  Patient Location: Endoscopy Unit  Anesthesia Type:General  Level of Consciousness: awake, alert  and oriented  Airway & Oxygen Therapy: Patient connected to nasal cannula oxygen  Post-op Assessment: Post -op Vital signs reviewed and stable  Post vital signs: Reviewed and stable  Last Vitals:  Vitals Value Taken Time  BP 95/54 02/17/20 1041  Temp    Pulse 77 02/17/20 1041  Resp 19 02/17/20 1041  SpO2 100 % 02/17/20 1041  Vitals shown include unvalidated device data.  Last Pain:  Vitals:   02/17/20 1041  TempSrc:   PainSc: 0-No pain         Complications: No complications documented.

## 2020-02-18 ENCOUNTER — Encounter: Payer: Self-pay | Admitting: Gastroenterology

## 2020-02-28 ENCOUNTER — Other Ambulatory Visit: Payer: Self-pay

## 2020-02-28 ENCOUNTER — Ambulatory Visit: Admission: EM | Admit: 2020-02-28 | Discharge: 2020-02-28 | Discharge status: Against Medical Advice | Payer: 59

## 2020-02-28 ENCOUNTER — Encounter: Payer: Self-pay | Admitting: Emergency Medicine

## 2020-02-28 ENCOUNTER — Emergency Department
Admission: EM | Admit: 2020-02-28 | Discharge: 2020-02-28 | Disposition: A | Discharge status: Home / Self Care | Payer: 59 | Attending: Emergency Medicine | Admitting: Emergency Medicine

## 2020-02-28 DIAGNOSIS — Z87891 Personal history of nicotine dependence: Secondary | ICD-10-CM | POA: Insufficient documentation

## 2020-02-28 DIAGNOSIS — S60312A Abrasion of left thumb, initial encounter: Secondary | ICD-10-CM | POA: Insufficient documentation

## 2020-02-28 DIAGNOSIS — Z23 Encounter for immunization: Secondary | ICD-10-CM | POA: Insufficient documentation

## 2020-02-28 DIAGNOSIS — S60519A Abrasion of unspecified hand, initial encounter: Secondary | ICD-10-CM

## 2020-02-28 DIAGNOSIS — S6992XA Unspecified injury of left wrist, hand and finger(s), initial encounter: Secondary | ICD-10-CM | POA: Diagnosis present

## 2020-02-28 DIAGNOSIS — W5503XA Scratched by cat, initial encounter: Secondary | ICD-10-CM

## 2020-02-28 MED ORDER — RABIES VACCINE, PCEC IM SUSR
1.0000 mL | Freq: Once | INTRAMUSCULAR | Status: AC
  Administered 2020-02-28: 1 mL via INTRAMUSCULAR
  Filled 2020-02-28: qty 1

## 2020-02-28 MED ORDER — TETANUS-DIPHTH-ACELL PERTUSSIS 5-2.5-18.5 LF-MCG/0.5 IM SUSY
0.5000 mL | PREFILLED_SYRINGE | Freq: Once | INTRAMUSCULAR | Status: AC
  Administered 2020-02-28: 0.5 mL via INTRAMUSCULAR
  Filled 2020-02-28: qty 0.5

## 2020-02-28 NOTE — Discharge Instructions (Addendum)
Return on Monday 03/02/2020 for repeat rabies vaccine Since you are already vaccinated for rabies, you only need 2 boosters, 1 on day 0 and 1 on day 3

## 2020-02-28 NOTE — ED Triage Notes (Signed)
Pt comes into the ED via POV c/o cat scratch to the left thumb on Sunday evening.  PT was told by health department to come in and be evaluated.  Pt has no redness or other complaints.

## 2020-02-28 NOTE — ED Provider Notes (Signed)
[facility] Emergency Department Provider Note  ____________________________________________   Event Date/Time   First MD Initiated Contact with Patient 02/28/20 1623     (approximate)  I have reviewed the triage vital signs and the nursing notes.   HISTORY  Chief Complaint Abrasion    HPI Wendy Mora is a 30 y.o. female presents emergency department with concerns of a cat scratch/bite to the left thumb.  States this happened on Sunday evening while she was trying to trap a feral cat.  Unsure if saliva hit her hand.  Patient was previously vaccinated for rabies in [place] in 2019.  Patient has records with her.  Due to this she will only need boosters.  Unsure of last Tdap    Past Medical History:  Diagnosis Date  . Anemia   . Anxiety   . Hyperlipidemia     Patient Active Problem List   Diagnosis Date Noted  . Rectal bleeding   . Anemia 12/09/2019  . Fatigue 12/09/2019  . Anxiety 12/09/2019  . Mitral valve prolapse 12/09/2019  . Encounter to establish care with new doctor 12/09/2019  . Constipation 12/09/2019  . Hemorrhoids 12/09/2019  . Elevated LDL cholesterol level 12/09/2019    Past Surgical History:  Procedure Laterality Date  . COLONOSCOPY WITH PROPOFOL N/A 02/17/2020   Procedure: COLONOSCOPY WITH PROPOFOL;  Surgeon: Wendy Reil, MD;  Location: [facility] ENDOSCOPY;  Service: Gastroenterology;  Laterality: N/A;  . NO PAST SURGERIES      Prior to Admission medications   Medication Sig Start Date End Date Taking? Authorizing Provider  ferrous sulfate 325 (65 FE) MG EC tablet Take 325 mg by mouth daily with breakfast.    [provider]  Specialty Vitamins Products (COLLAGEN ULTRA) CAPS Take 1 capsule by mouth daily as needed.    [provider]    Allergies Mango flavor  Family History  Problem Relation Age of Onset  . Diabetes Mother   . Depression Mother   . Thyroid disease Mother   . Hyperlipidemia  Mother   . Hypertension Mother   . Heart disease Father   . Alcohol abuse Father   . Diabetes Brother   . Mental retardation Brother   . Cancer Maternal Grandmother   . Cancer Maternal Grandfather   . Alzheimer's disease Paternal Grandmother   . Obesity Brother   . Bipolar disorder Brother   . High Cholesterol Sister     Social History Social History   Tobacco Use  . Smoking status: Former Smoker    Years: 0.50    Quit date: 06/08/2019    Years since quitting: 0.7  . Smokeless tobacco: Never Used  . Tobacco comment: social smoker   Vaping Use  . Vaping Use: Never used  Substance Use Topics  . Alcohol use: Yes    Comment: socially  . Drug use: Never    Review of Systems  Constitutional: No fever/chills Eyes: No visual changes. ENT: No sore throat. Respiratory: Denies cough Genitourinary: Negative for dysuria. Musculoskeletal: Negative for back pain. Skin: Negative for rash. Psychiatric: no mood changes,     ____________________________________________   PHYSICAL EXAM:  VITAL SIGNS: ED Triage Vitals [02/28/20 1611]  Enc Vitals Group     BP      Pulse      Resp      Temp      Temp src      SpO2      Weight 145 lb (65.8 kg)  Height 5\' 5"  (1.651 m)     Head Circumference      Peak Flow      Pain Score 0     Pain Loc      Pain Edu?      Excl. in GC?     Constitutional: Alert and oriented. Well appearing and in no acute distress. Eyes: Conjunctivae are normal.  Head: Atraumatic. Nose: No congestion/rhinnorhea. Mouth/Throat: Mucous membranes are moist.   Neck:  supple no lymphadenopathy noted Cardiovascular: Normal rate, regular rhythm.  Respiratory: Normal respiratory effort.  No retractions GU: deferred Musculoskeletal: FROM all extremities, warm and well perfused Neurologic:  Normal speech and language.  Skin:  Skin is warm, dry, already healing abrasion noted to the left thumb, no pus or drainage noted no rash noted. Psychiatric: Mood and  affect are normal. Speech and behavior are normal.  ____________________________________________   LABS (all labs ordered are listed, but only abnormal results are displayed)  Labs Reviewed - No data to display ____________________________________________   ____________________________________________  RADIOLOGY    ____________________________________________   PROCEDURES  Procedure(s) performed: No  Procedures    ____________________________________________   INITIAL IMPRESSION / ASSESSMENT AND PLAN / ED COURSE  Pertinent labs & imaging results that were available during my care of the patient were reviewed by me and considered in my medical decision making (see chart for details).   Patient 30 year old female who is already vaccinated for rabies presents with a contact with a feral cat resulting in a scratch.  Unsure if any saliva touched her hand.  Patient had contacted CDC and infectious nurse who told her to come and get her booster for rabies.  Physical exam shows an already healing abrasion.  Patient was given Tdap and RabAvert here today.  She will need to return on Monday, 03/02/2020 for repeat RabAvert.  Encouraged her to wear gloves when she is trapping cats.  This may prevent her needing additional RabAvert in the future.  She was discharged in stable condition.     Wendy Mora was evaluated in Emergency Department on 02/28/2020 for the symptoms described in the history of present illness. She was evaluated in the context of the global COVID-19 pandemic, which necessitated consideration that the patient might be at risk for infection with the SARS-CoV-2 virus that causes COVID-19. Institutional protocols and algorithms that pertain to the evaluation of patients at risk for COVID-19 are in a state of rapid change based on information released by regulatory bodies including the CDC and federal and state organizations. These policies and algorithms were  followed during the patient's care in the ED.    As part of my medical decision making, I reviewed the following data within the electronic MEDICAL RECORD NUMBER Nursing notes reviewed and incorporated, Old chart reviewed, Notes from prior ED visits and [place] Controlled Substance Database  ____________________________________________   FINAL CLINICAL IMPRESSION(S) / ED DIAGNOSES  Final diagnoses:  Cat scratch of hand, unspecified laterality, initial encounter      NEW MEDICATIONS STARTED DURING THIS VISIT:  New Prescriptions   No medications on file     Note:  This document was prepared using Dragon voice recognition software and may include unintentional dictation errors.    03/01/2020, PA-C 02/28/20 1642    03/01/20, MD 02/28/20 [phone]

## 2020-03-02 ENCOUNTER — Other Ambulatory Visit: Payer: Self-pay

## 2020-03-02 ENCOUNTER — Ambulatory Visit
Admission: EM | Admit: 2020-03-02 | Discharge: 2020-03-02 | Disposition: A | Discharge status: Home / Self Care | Payer: 59 | Attending: Family Medicine | Admitting: Family Medicine

## 2020-03-02 DIAGNOSIS — Z203 Contact with and (suspected) exposure to rabies: Secondary | ICD-10-CM

## 2020-03-02 MED ORDER — RABIES VACCINE, PCEC IM SUSR
1.0000 mL | Freq: Once | INTRAMUSCULAR | Status: AC
  Administered 2020-03-02: 1 mL via INTRAMUSCULAR

## 2020-03-02 NOTE — ED Triage Notes (Signed)
Patient is here for Rabies Vaccine. Already had previous immunizations for this. Is only in need of 2nd Rabies dosage of Rabavert here today per ER Provider. Patient tolerated other injections well.

## 2020-03-26 ENCOUNTER — Encounter: Payer: Self-pay | Admitting: Gastroenterology

## 2020-03-26 ENCOUNTER — Telehealth (INDEPENDENT_AMBULATORY_CARE_PROVIDER_SITE_OTHER): Payer: 59 | Admitting: Gastroenterology

## 2020-03-26 DIAGNOSIS — K602 Anal fissure, unspecified: Secondary | ICD-10-CM | POA: Diagnosis not present

## 2020-03-26 DIAGNOSIS — K625 Hemorrhage of anus and rectum: Secondary | ICD-10-CM | POA: Diagnosis not present

## 2020-03-26 NOTE — Progress Notes (Signed)
Lannette Donath, MD [address]  [address]  [place], [place] [address]  Main: [phone]  Fax: [phone]    Gastroenterology Consultation Video Visit  Referring Provider:     Tarri Fuller, FNP Primary Care Physician:  Tarri Fuller, FNP Primary Gastroenterologist:  Dr. Arlyss Repress Reason for Consultation:     Rectal pain and bleeding        HPI:   Wendy Mora is a 30 y.o. female referred by Dr. Anitra Lauth, Jodelle Gross, FNP  for consultation & management of rectal pain and bleeding  Virtual Visit Video Note  I connected with Wendy Mora on 03/26/20 at  3:00 PM EDT by video and verified that I am speaking with the correct person using two identifiers.   I discussed the limitations, risks, security and privacy concerns of performing an evaluation and management service by video and the availability of in person appointments. I also discussed with the patient that there may be a patient responsible charge related to this service. The patient expressed understanding and agreed to proceed.  Location of the Patient: Home  Location of the provider: Office  Persons participating in the visit: Patient and provider only   History of Present Illness: Wendy Mora is a 30 year old female with history of constipation, rectal pain and bleeding.  She is currently being treated for anal fissure.  She also underwent colonoscopy which was unremarkable.  She reports that her constipation is worse taking iron pills.  She is not able to use nitroglycerin daily for anal fissure.  She reports her rectal pain is overall improving, worse when she has hard bowel movement.  She does not have any other concerns today  NSAIDs: None  Antiplts/Anticoagulants/Anti thrombotics: None  GI Procedures:  Colonoscopy 2022 - Preparation of the colon was fair. - The entire examined colon is normal. - The distal rectum and anal verge are normal on retroflexion view. - No specimens  collected.   Past Medical History:  Diagnosis Date  . Anemia   . Anxiety   . Hyperlipidemia     Past Surgical History:  Procedure Laterality Date  . COLONOSCOPY WITH PROPOFOL N/A 02/17/2020   Procedure: COLONOSCOPY WITH PROPOFOL;  Surgeon: Toney Reil, MD;  Location: [facility] ENDOSCOPY;  Service: Gastroenterology;  Laterality: N/A;  . NO PAST SURGERIES      Current Outpatient Medications:  .  ferrous sulfate 325 (65 FE) MG EC tablet, Take 325 mg by mouth daily with breakfast., Disp: , Rfl:  .  Specialty Vitamins Products (COLLAGEN ULTRA) CAPS, Take 1 capsule by mouth daily as needed., Disp: , Rfl:    Family History  Problem Relation Age of Onset  . Diabetes Mother   . Depression Mother   . Thyroid disease Mother   . Hyperlipidemia Mother   . Hypertension Mother   . Heart disease Father   . Alcohol abuse Father   . Diabetes Brother   . Mental retardation Brother   . Cancer Maternal Grandmother   . Cancer Maternal Grandfather   . Alzheimer's disease Paternal Grandmother   . Obesity Brother   . Bipolar disorder Brother   . High Cholesterol Sister      Social History   Tobacco Use  . Smoking status: Former Smoker    Years: 0.50    Quit date: 06/08/2019    Years since quitting: 0.8  . Smokeless tobacco: Never Used  . Tobacco comment: social smoker   Vaping Use  . Vaping  Use: Never used  Substance Use Topics  . Alcohol use: Yes    Comment: socially  . Drug use: Never    Allergies as of 03/26/2020 - Review Complete 03/26/2020  Allergen Reaction Noted  . Mango flavor Rash 12/09/2019     Imaging Studies: No abdominal imaging  Assessment and Plan:   Wendy Mora is a 30 y.o. female with no significant past medical history is seen in consultation for chronic constipation and rectal bleeding associated with rectal pain, itching and swelling, history of normocytic anemia.  Colonoscopy is unremarkable.  Rectal bleeding is most likely from anal  fissure Continue topical nitroglycerin for anal fissure Switch oral iron to every other day to regulate constipation Do not recommend any further work-up at this time    Follow Up Instructions:   I discussed the assessment and treatment plan with the patient. The patient was provided an opportunity to ask questions and all were answered. The patient agreed with the plan and demonstrated an understanding of the instructions.   The patient was advised to call back or seek an in-person evaluation if the symptoms worsen or if the condition fails to improve as anticipated.  I provided 11 minutes of face-to-face time during this encounter.   Follow up as needed   Arlyss Repress, MD

## 2020-05-13 ENCOUNTER — Telehealth: Payer: 59 | Admitting: Physician Assistant

## 2020-05-13 ENCOUNTER — Encounter: Payer: Self-pay | Admitting: Physician Assistant

## 2020-05-13 ENCOUNTER — Other Ambulatory Visit: Payer: Self-pay | Admitting: Physician Assistant

## 2020-05-13 DIAGNOSIS — J019 Acute sinusitis, unspecified: Secondary | ICD-10-CM | POA: Diagnosis not present

## 2020-05-13 DIAGNOSIS — B9689 Other specified bacterial agents as the cause of diseases classified elsewhere: Secondary | ICD-10-CM | POA: Diagnosis not present

## 2020-05-13 MED ORDER — FLUTICASONE PROPIONATE 50 MCG/ACT NA SUSP: 2.0000 | Freq: Every day | NASAL | 0 refills | Status: DC

## 2020-05-13 MED ORDER — AMOXICILLIN-POT CLAVULANATE 875-125 MG PO TABS: 1.0000 | ORAL_TABLET | Freq: Two times a day (BID) | ORAL | 0 refills | Status: DC

## 2020-05-13 MED ORDER — ONDANSETRON 4 MG PO TBDP: 4.0000 mg | ORAL_TABLET | Freq: Three times a day (TID) | ORAL | 0 refills | Status: DC | PRN

## 2020-05-13 NOTE — Patient Instructions (Signed)
Instructions sent to patients MyChart.

## 2020-05-13 NOTE — Addendum Note (Signed)
Addended by: Waldon Merl on: 05/13/2020 12:43 PM   Modules accepted: Orders

## 2020-05-13 NOTE — Progress Notes (Signed)
Wendy Mora, Wendy Mora are scheduled for a virtual visit with your provider today.    Just as we do with appointments in the office, we must obtain your consent to participate.  Your consent will be active for this visit and any virtual visit you may have with one of our providers in the next 365 days.    If you have a MyChart account, I can also send a copy of this consent to you electronically.  All virtual visits are billed to your insurance company just like a traditional visit in the office.  As this is a virtual visit, video technology does not allow for your provider to perform a traditional examination.  This may limit your provider's ability to fully assess your condition.  If your provider identifies any concerns that need to be evaluated in person or the need to arrange testing such as labs, EKG, etc, we will make arrangements to do so.    Although advances in technology are sophisticated, we cannot ensure that it will always work on either your end or our end.  If the connection with a video visit is poor, we may have to switch to a telephone visit.  With either a video or telephone visit, we are not always able to ensure that we have a secure connection.   I need to obtain your verbal consent now.   Are you willing to proceed with your visit today?   Wendy Mora has provided verbal consent on 05/13/2020 for a virtual visit (video or telephone).  Piedad Climes, PA-C 05/13/2020  10:25 AM  Virtual Visit via Video   I connected with patient on 05/13/20 at 10:15 AM EDT by a video enabled telemedicine application and verified that I am speaking with the correct person using two identifiers.  Location patient: Home Location provider: [facility] Persons participating in the virtual visit: Patient, Provider  I discussed the limitations of evaluation and management by telemedicine and the availability of in person appointments. The patient expressed understanding and  agreed to proceed.  Subjective:   HPI:   Patient presents via [facility] today complaining of greater than 1 week of worsening sinus symptoms.  Initially noted just mild nasal congestion with postnasal drip and rhinorrhea now progressed into increased nasal congestion with significant nasal discharge, thick green discharge, sinus pressure and discomfort.  Now having some mild voice hoarseness.  Denies fever, chills or aches.  Denies ear pain or tooth pain.  Denies any chest congestion or shortness of breath.  Denies recent travel or sick contact.  Patient does have some occasional seasonal allergies for which she takes Zyrtec.  Has continued to take this during current symptoms.  Has also taken OTC Mucinex and Tylenol.  Of note she did take an at home COVID test 6 days into her symptoms that was negative.  ROS:   See pertinent positives and negatives per HPI.  Patient Active Problem List   Diagnosis Date Noted  . Rectal bleeding   . Fatigue 12/09/2019  . Anxiety 12/09/2019  . Mitral valve prolapse 12/09/2019  . Constipation 12/09/2019  . Elevated LDL cholesterol level 12/09/2019    Social History   Tobacco Use  . Smoking status: Former Smoker    Years: 0.50    Quit date: 06/08/2019    Years since quitting: 0.9  . Smokeless tobacco: Never Used  . Tobacco comment: social smoker   Substance Use Topics  . Alcohol use: Yes    Comment: socially  Current Outpatient Medications:  .  ferrous sulfate 325 (65 FE) MG EC tablet, Take 325 mg by mouth daily with breakfast., Disp: , Rfl:  .  Specialty Vitamins Products (COLLAGEN ULTRA) CAPS, Take 1 capsule by mouth daily as needed., Disp: , Rfl:   Allergies  Allergen Reactions  . Mango Flavor Rash    Objective:   There were no vitals taken for this visit.  Patient is well-developed, well-nourished in no acute distress.  Resting comfortably at home.  Head is normocephalic, atraumatic.  No labored breathing.  Speech is clear and  coherent with logical content.  Patient is alert and oriented at baseline.   Assessment and Plan:   1. Acute bacterial sinusitis Rx Augmentin.  Increase fluids.  Rest.  Saline nasal spray.  Probiotic.  Mucinex as directed.  Humidifier in bedroom.  Start Flonase.  Rx sent.  Discussed follow-up for any not improving/resolving symptoms.  Patient voiced understanding and agreement with plan.   Piedad Climes, PA-C 05/13/2020

## 2020-05-14 MED ORDER — FLUTICASONE PROPIONATE 50 MCG/ACT NA SUSP: 2.0000 | Freq: Every day | NASAL | 0 refills | Status: DC

## 2020-05-14 NOTE — Addendum Note (Signed)
Addended by: Waldon Merl on: 05/14/2020 08:48 AM   Modules accepted: Orders

## 2020-06-04 ENCOUNTER — Encounter: Payer: Self-pay | Admitting: Internal Medicine

## 2020-06-05 ENCOUNTER — Encounter: Payer: 59 | Admitting: Family Medicine

## 2020-06-11 ENCOUNTER — Telehealth: Payer: 59 | Admitting: Physician Assistant

## 2020-06-11 ENCOUNTER — Telehealth: Payer: Self-pay | Admitting: *Deleted

## 2020-06-11 ENCOUNTER — Encounter: Payer: Self-pay | Admitting: Physician Assistant

## 2020-06-11 DIAGNOSIS — Z20822 Contact with and (suspected) exposure to covid-19: Secondary | ICD-10-CM | POA: Diagnosis not present

## 2020-06-11 DIAGNOSIS — S01331A Puncture wound without foreign body of right ear, initial encounter: Secondary | ICD-10-CM | POA: Diagnosis not present

## 2020-06-11 MED ORDER — DOXYCYCLINE HYCLATE 100 MG PO TABS: 100.0000 mg | ORAL_TABLET | Freq: Two times a day (BID) | ORAL | 0 refills | Status: DC

## 2020-06-11 NOTE — Patient Instructions (Signed)
Instructions sent to patients MyChart.

## 2020-06-11 NOTE — Progress Notes (Signed)
Ms. Wendy Mora, Wendy Mora are scheduled for a virtual visit with your provider today.    Just as we do with appointments in the office, we must obtain your consent to participate.  Your consent will be active for this visit and any virtual visit you may have with one of our providers in the next 365 days.    If you have a MyChart account, I can also send a copy of this consent to you electronically.  All virtual visits are billed to your insurance company just like a traditional visit in the office.  As this is a virtual visit, video technology does not allow for your provider to perform a traditional examination.  This may limit your provider's ability to fully assess your condition.  If your provider identifies any concerns that need to be evaluated in person or the need to arrange testing such as labs, EKG, etc, we will make arrangements to do so.    Although advances in technology are sophisticated, we cannot ensure that it will always work on either your end or our end.  If the connection with a video visit is poor, we may have to switch to a telephone visit.  With either a video or telephone visit, we are not always able to ensure that we have a secure connection.   I need to obtain your verbal consent now.   Are you willing to proceed with your visit today?   Wendy Mora has provided verbal consent on 06/11/2020 for a virtual visit (video or telephone).  Piedad Climes, PA-C 06/11/2020  10:03 AM  Virtual Visit via Video   I connected with patient on 06/11/20 at 10:00 AM EDT by a video enabled telemedicine application and verified that I am speaking with the correct person using two identifiers.  Location patient: Home Location provider: [facility] Persons participating in the virtual visit: Patient, Provider  I discussed the limitations of evaluation and management by telemedicine and the availability of in person appointments. The patient expressed understanding and  agreed to proceed.  Subjective:   HPI:   Patient presents via Care[facility] today to discuss 2 separate issues.  Patient endorses developing pain, swelling and purulent drainage around the site of a piercing in her right ear.  Patient notes she has had a little bit of issue with the piercing since she got it initially but has never had a true infection.  Has noted low-grade fever over the past couple days.  Denies any change in hearing.  Denies any swelling or tenderness of the neck around her ear.  Still has the piercing in place.  Patient also notes abrupt onset of sore throat, nasal congestion, body aches, fatigue and low-grade fever starting yesterday evening.  Has continued into this morning.  Denies sinus pain, tooth pain, chest congestion or shortness of breath.  Denies any known sick contact.  Does states she recently returned from a trip to [place].   ROS:   See pertinent positives and negatives per HPI.  Patient Active Problem List   Diagnosis Date Noted  . Rectal bleeding   . Fatigue 12/09/2019  . Anxiety 12/09/2019  . Mitral valve prolapse 12/09/2019  . Constipation 12/09/2019  . Elevated LDL cholesterol level 12/09/2019    Social History   Tobacco Use  . Smoking status: Former Smoker    Years: 0.50    Quit date: 06/08/2019    Years since quitting: 1.0  . Smokeless tobacco: Never Used  . Tobacco comment:  social smoker   Substance Use Topics  . Alcohol use: Yes    Comment: socially    Current Outpatient Medications:  .  amoxicillin-clavulanate (AUGMENTIN) 875-125 MG tablet, Take 1 tablet by mouth 2 (two) times daily., Disp: 14 tablet, Rfl: 0 .  ferrous sulfate 325 (65 FE) MG EC tablet, Take 325 mg by mouth daily with breakfast., Disp: , Rfl:  .  fluticasone (FLONASE) 50 MCG/ACT nasal spray, Place 2 sprays into both nostrils daily., Disp: 16 g, Rfl: 0 .  Specialty Vitamins Products (COLLAGEN ULTRA) CAPS, Take 1 capsule by mouth daily as needed., Disp: , Rfl:    Allergies  Allergen Reactions  . Mango Flavor Rash    Objective:   There were no vitals taken for this visit.  Patient is well-developed, well-nourished in no acute distress.  Resting comfortably on couch at home.  Head is normocephalic, atraumatic.  No labored breathing.  Speech is clear and coherent with logical content.  Patient is alert and oriented at baseline.  Right ear examined.  Patient with a rook piercing in place within the antihelix of R ear.  There is noted erythema, swelling and crusting around the area.  Is very tender to touch per patient report.  Erythema does not extend to the rest of the ear.  Assessment and Plan:   1. Complication of right ear piercing, initial encounter Patient with active infection.  Thankfully no alarm signs or symptoms present.  Will start doxycycline to cover for most typical causative organisms plus MRSA coverage.  Can use Tylenol or ibuprofen if needed for pain.  Supportive measures reviewed.  If not improving, or if anything worsens or new symptoms develop, she is to seek in person evaluation.  2. Suspected COVID-19 virus infection Abrupt onset of fever, URI symptoms and body aches.  This occurring after recent plane ride back from [place].  She has been instructed to be COVID tested and to quarantine until results are in.  Supportive measures and OTC medications reviewed.  Patient enrolled in symptom monitoring program so we can keep a track of her symptoms.  We will make further determinations once we get her testing results in.  Strict ER precautions reviewed with patient.  Patient voiced understanding and agreement with the plan.    Piedad Climes, [place] 06/11/2020

## 2020-06-11 NOTE — Telephone Encounter (Signed)
Pt's Questionnaire response of worsening weakness triggered BPA; pt evaluated via Televist on 06/11/20; attempted to contact pt to discuss symptoms; left message on voicemail with callback number [phone]; message sent to pt via MyChart, "Thank you for your reply. Prioritize activities, rest as needed, and to continue to hydrate. Please continue to monitor your symptoms and follow your care plan. Also seek prompt medical attention in the ED if your symptoms worsen or if you have shortness of breath, difficulty breathing, or chest pain. Please continue to monitor your symptoms and follow your care plan as discussed with the provider on 06/11/20"; she is seen at [facility]; will route to office for notification of encounter.

## 2020-06-15 ENCOUNTER — Encounter: Payer: Self-pay | Admitting: Internal Medicine

## 2020-06-15 ENCOUNTER — Ambulatory Visit: Payer: Self-pay

## 2020-06-15 ENCOUNTER — Telehealth: Payer: Self-pay | Admitting: *Deleted

## 2020-06-15 NOTE — Telephone Encounter (Signed)
Patient answered my chart questionnaire with new COVID symptoms of diarrhea and dizziness.  Pt unsure of reason for dizziness. Diarrhea may be from Antibiotic prescribed for infected piercing via virtual visit.   Pt will follow home advice, as she does not want to be seen. She had an appointment for an office visit today that she cancelled.   Pt will rest and elevate legs and drink fluids. Pt will call back or seek immediate help if symptoms worsen.  Reason for Disposition . [1] MILD dizziness (e.g., walking normally) AND [2] has NOT been evaluated by physician for this  (Exception: dizziness caused by heat exposure, sudden standing, or poor fluid intake) . Dizziness caused by not drinking enough fluids  Answer Assessment - Initial Assessment Questions 1. DESCRIPTION: "Describe your dizziness."     Dizziness  2. LIGHTHEADED: "Do you feel lightheaded?" (e.g., somewhat faint, woozy, weak upon standing)     Lightheaded 3. VERTIGO: "Do you feel like either you or the room is spinning or tilting?" (i.e. vertigo)     no 4. SEVERITY: "How bad is it?"  "Do you feel like you are going to faint?" "Can you stand and walk?"   - MILD: Feels slightly dizzy, but walking normally.   - MODERATE: Feels unsteady when walking, but not falling; interferes with normal activities (e.g., school, work).   - SEVERE: Unable to walk without falling, or requires assistance to walk without falling; feels like passing out now.     Mild to moderate 5. ONSET:  "When did the dizziness begin?"     today 6. AGGRAVATING FACTORS: "Does anything make it worse?" (e.g., standing, change in head position)     No 7. HEART RATE: "Can you tell me your heart rate?" "How many beats in 15 seconds?"  (Note: not all patients can do this)       56 8. CAUSE: "What do you think is causing the dizziness?"     Unknown - COVID perhaps antibiotic 9. RECURRENT SYMPTOM: "Have you had dizziness before?" If Yes, ask: "When was the last time?"  "What happened that time?"     A long time ago 10. OTHER SYMPTOMS: "Do you have any other symptoms?" (e.g., fever, chest pain, vomiting, diarrhea, bleeding)       Diarrhea  11. PREGNANCY: "Is there any chance you are pregnant?" "When was your last menstrual period?"       unknown  Protocols used: DIZZINESS [facility]

## 2020-06-15 NOTE — Telephone Encounter (Signed)
Attempted to call patient- left message to call back- copy of message sent in MyChart- response also. Per patient MyChart Companion: Patient reports increased diarrhea: Advised: Increased oral fluids and bland foods. Avoid alcohol,spicy food, caffeine or fatty foods that could make diarrhea worse. Continue to monitor for signs of dehydration(increased thirst, decreased urine output, yellow urine, dry skin, headache, dizziness) Try over the counter medication- Imodium, Kaopectate,Pepto Bismol Notify provider if symptoms persist, become severe, or signs of dehydration develop.

## 2020-06-30 ENCOUNTER — Ambulatory Visit (INDEPENDENT_AMBULATORY_CARE_PROVIDER_SITE_OTHER): Payer: 59 | Admitting: Internal Medicine

## 2020-06-30 ENCOUNTER — Encounter: Payer: Self-pay | Admitting: Internal Medicine

## 2020-06-30 ENCOUNTER — Other Ambulatory Visit (HOSPITAL_COMMUNITY)
Admission: RE | Admit: 2020-06-30 | Discharge: 2020-06-30 | Disposition: A | Discharge status: Home / Self Care | Payer: 59 | Source: Ambulatory Visit | Attending: Internal Medicine | Admitting: Internal Medicine

## 2020-06-30 ENCOUNTER — Other Ambulatory Visit: Payer: Self-pay

## 2020-06-30 VITALS — BP 104/59 | HR 69 | Temp 97.8°F | Resp 17 | Ht 65.0 in | Wt 144.0 lb

## 2020-06-30 DIAGNOSIS — Z113 Encounter for screening for infections with a predominantly sexual mode of transmission: Secondary | ICD-10-CM

## 2020-06-30 DIAGNOSIS — Z0001 Encounter for general adult medical examination with abnormal findings: Secondary | ICD-10-CM

## 2020-06-30 DIAGNOSIS — K219 Gastro-esophageal reflux disease without esophagitis: Secondary | ICD-10-CM

## 2020-06-30 DIAGNOSIS — F419 Anxiety disorder, unspecified: Secondary | ICD-10-CM | POA: Diagnosis not present

## 2020-06-30 DIAGNOSIS — D509 Iron deficiency anemia, unspecified: Secondary | ICD-10-CM | POA: Insufficient documentation

## 2020-06-30 DIAGNOSIS — D500 Iron deficiency anemia secondary to blood loss (chronic): Secondary | ICD-10-CM | POA: Diagnosis not present

## 2020-06-30 DIAGNOSIS — E7800 Pure hypercholesterolemia, unspecified: Secondary | ICD-10-CM | POA: Diagnosis not present

## 2020-06-30 NOTE — Progress Notes (Signed)
Subjective:    Patient ID: Wendy Mora, female    DOB: Mar 16, 1990, 30 y.o.   MRN: [id number]  HPI  Patient presents the clinic today for her annual exam.  She is also due to follow-up chronic conditions.  Anxiety: Intermittent. She is not currently seeing a therapist.  She denies depression, SI/HI.  Iron Deficiency Anemia: Secondary to hemorrhoids. Her last H/H was 11.6/35.2, 02/2020.  She is taking an oral iron supplement OTC intermittently.  She does not follow with hematology.  GERD: Triggered by spicy foods. She takes Tums OTC as needed with good relief of symptoms. There is no upper GI on file.  Flu: 10/2018 Tetanus: 02/2020 COVID: [place] x3  Pap smear: 06/2018 Dentist: as needed  Diet: She does eat meat. She consumes fruits and veggies. She does eat some fried foods. She drinks mostly water, juice. Exercise: Physical trainer 1 x week   Review of Systems     Past Medical History:  Diagnosis Date   Anemia    Anxiety    Hyperlipidemia     Current Outpatient Medications  Medication Sig Dispense Refill   doxycycline (VIBRA-TABS) 100 MG tablet Take 1 tablet (100 mg total) by mouth 2 (two) times daily. 14 tablet 0   ferrous sulfate 325 (65 FE) MG EC tablet Take 325 mg by mouth daily with breakfast.     fluticasone (FLONASE) 50 MCG/ACT nasal spray Place 2 sprays into both nostrils daily. 16 g 0   Specialty Vitamins Products (COLLAGEN ULTRA) CAPS Take 1 capsule by mouth daily as needed.     No current facility-administered medications for this visit.    Allergies  Allergen Reactions   Mango Flavor Rash    Family History  Problem Relation Age of Onset   Diabetes Mother    Depression Mother    Thyroid disease Mother    Hyperlipidemia Mother    Hypertension Mother    Heart disease Father    Alcohol abuse Father    Diabetes Brother    Mental retardation Brother    Cancer Maternal Grandmother    Cancer Maternal Grandfather    Alzheimer's disease Paternal  Grandmother    Obesity Brother    Bipolar disorder Brother    High Cholesterol Sister     Social History   Socioeconomic History   Marital status: Single    Spouse name: Not on file   Number of children: Not on file   Years of education: Not on file   Highest education level: Not on file  Occupational History   Not on file  Tobacco Use   Smoking status: Former    Years: 0.50    Pack years: 0.00    Types: Cigarettes    Quit date: 06/08/2019    Years since quitting: 1.0   Smokeless tobacco: Never   Tobacco comments:    social smoker   Vaping Use   Vaping Use: Never used  Substance and Sexual Activity   Alcohol use: Yes    Comment: socially   Drug use: Never   Sexual activity: Not on file  Other Topics Concern   Not on file  Social History Narrative   Not on file   Social Determinants of Health   Financial Resource Strain: Not on file  Food Insecurity: Not on file  Transportation Needs: Not on file  Physical Activity: Not on file  Stress: Not on file  Social Connections: Not on file  Intimate Partner Violence: Not on file  Constitutional: Denies fever, malaise, fatigue, headache or abrupt weight changes.  HEENT: Denies eye pain, eye redness, ear pain, ringing in the ears, wax buildup, runny nose, nasal congestion, bloody nose, or sore throat. Respiratory: Denies difficulty breathing, shortness of breath, cough or sputum production.   Cardiovascular: Denies chest pain, chest tightness, palpitations or swelling in the hands or feet.  Gastrointestinal: Pt reports intermittent reflux, hemorroids. Denies abdominal pain, bloating, constipation, diarrhea or blood in the stool.  GU: Denies urgency, frequency, pain with urination, burning sensation, blood in urine, odor or discharge. Musculoskeletal: Denies decrease in range of motion, difficulty with gait, muscle pain or joint pain and swelling.  Skin: Denies redness, rashes, lesions or ulcercations.  Neurological:  Denies dizziness, difficulty with memory, difficulty with speech or problems with balance and coordination.  Psych: Patient reports intermittent anxiety.  Denies depression, SI/HI.  No other specific complaints in a complete review of systems (except as listed in HPI above).  Objective:   Physical Exam  BP (!) 104/59 (BP Location: Right Arm, Patient Position: Sitting, Cuff Size: Normal)   Pulse 69   Temp 97.8 F (36.6 C) (Temporal)   Resp 17   Ht _0  (1.651 m)   Wt 144 lb (65.3 kg)   LMP 06/12/2020   SpO2 100%   BMI 23.96 kg/m   Wt Readings from Last 3 Encounters:  02/28/20 145 lb (65.8 kg)  02/17/20 145 lb (65.8 kg)  01/28/20 148 lb (67.1 kg)    General: Appears her stated age, well developed, well nourished in NAD. Skin: Warm, dry and intact. No ulcerations noted. HEENT: Head: normal shape and size; Eyes: sclera white and EOMs intact;  Neck:  Neck supple, trachea midline. No masses, lumps or thyromegaly present.  Cardiovascular: Normal rate and rhythm. S1,S2 noted.  No murmur, rubs or gallops noted. No JVD or BLE edema.  Pulmonary/Chest: Normal effort and positive vesicular breath sounds. No respiratory distress. No wheezes, rales or ronchi noted.  Abdomen: Soft and nontender. Normal bowel sounds. No distention or masses noted. Liver, spleen and kidneys non palpable. Musculoskeletal: Strength 5/5 BUE/BLE. No difficulty with gait.  Neurological: Alert and oriented. Cranial nerves II-XII grossly intact. Coordination normal.  Psychiatric: Mood and affect normal. Behavior is normal. Judgment and thought content normal.    BMET    Component Value Date/Time   NA 135 12/23/2019 0804   K 4.0 12/23/2019 0804   CL 101 12/23/2019 0804   CO2 25 12/23/2019 0804   GLUCOSE 84 12/23/2019 0804   BUN 11 12/23/2019 0804   CREATININE 0.68 12/23/2019 0804   CALCIUM 9.3 12/23/2019 0804   GFRNONAA 118 12/23/2019 0804   GFRAA 137 12/23/2019 0804    Lipid Panel  No results found for:  CHOL, TRIG, HDL, CHOLHDL, VLDL, LDLCALC  CBC    Component Value Date/Time   WBC 6.0 01/24/2020 1312   WBC 6.4 12/23/2019 0804   RBC 4.11 01/24/2020 1312   RBC 3.91 12/23/2019 0804   HGB 11.6 01/24/2020 1312   HCT 35.2 01/24/2020 1312   PLT 341 01/24/2020 1312   MCV 86 01/24/2020 1312   MCH 28.2 01/24/2020 1312   MCH 28.9 12/23/2019 0804   MCHC 33.0 01/24/2020 1312   MCHC 33.5 12/23/2019 0804   RDW 13.1 01/24/2020 1312   LYMPHSABS 2,144 12/23/2019 0804   EOSABS 90 12/23/2019 0804   BASOSABS 70 12/23/2019 0804    Hgb A1C No results found for: HGBA1C  Assessment & Plan:   Preventative Health Maintenance, Screen for STD:  Encouraged her to get a flu shot in fall Tetanus UTD Encouraged her to get her COVID-vaccine Pap smear due 2023 Encouraged her to consume a balanced diet exercise regimen Advised her to see an eye doctor and dentist annually We will check CBC, c-Met, lipid, A1c, HIV, RPR, Hep C, urine gonorrhea, chlamydia and trichtoday  RTC in 1 year, sooner if needed Webb Silversmith, NP This visit occurred during the SARS-CoV-2 public health emergency.  Safety protocols were in place, including screening questions prior to the visit, additional usage of staff PPE, and extensive cleaning of exam room while observing appropriate contact time as indicated for disinfecting solutions.

## 2020-06-30 NOTE — Assessment & Plan Note (Signed)
She is actively looking for a therapist, declines referral Support offered

## 2020-06-30 NOTE — Patient Instructions (Signed)
Health Maintenance, Female Adopting a healthy lifestyle and getting preventive care are important in promoting health and wellness. Ask your health care provider about: The right schedule for you to have regular tests and exams. Things you can do on your own to prevent diseases and keep yourself healthy. What should I know about diet, weight, and exercise? Eat a healthy diet  Eat a diet that includes plenty of vegetables, fruits, low-fat dairy products, and lean protein. Do not eat a lot of foods that are high in solid fats, added sugars, or sodium.  Maintain a healthy weight Body mass index (BMI) is used to identify weight problems. It estimates body fat based on height and weight. Your health care provider can help determineyour BMI and help you achieve or maintain a healthy weight. Get regular exercise Get regular exercise. This is one of the most important things you can do for your health. Most adults should: Exercise for at least 150 minutes each week. The exercise should increase your heart rate and make you sweat (moderate-intensity exercise). Do strengthening exercises at least twice a week. This is in addition to the moderate-intensity exercise. Spend less time sitting. Even light physical activity can be beneficial. Watch cholesterol and blood lipids Have your blood tested for lipids and cholesterol at 30 years of age, then havethis test every 5 years. Have your cholesterol levels checked more often if: Your lipid or cholesterol levels are high. You are older than 30 years of age. You are at high risk for heart disease. What should I know about cancer screening? Depending on your health history and family history, you may need to have cancer screening at various ages. This may include screening for: Breast cancer. Cervical cancer. Colorectal cancer. Skin cancer. Lung cancer. What should I know about heart disease, diabetes, and high blood pressure? Blood pressure and heart  disease High blood pressure causes heart disease and increases the risk of stroke. This is more likely to develop in people who have high blood pressure readings, are of African descent, or are overweight. Have your blood pressure checked: Every 3-5 years if you are 18-39 years of age. Every year if you are 40 years old or older. Diabetes Have regular diabetes screenings. This checks your fasting blood sugar level. Have the screening done: Once every three years after age 40 if you are at a normal weight and have a low risk for diabetes. More often and at a younger age if you are overweight or have a high risk for diabetes. What should I know about preventing infection? Hepatitis B If you have a higher risk for hepatitis B, you should be screened for this virus. Talk with your health care provider to find out if you are at risk forhepatitis B infection. Hepatitis C Testing is recommended for: Everyone born from 1945 through 1965. Anyone with known risk factors for hepatitis C. Sexually transmitted infections (STIs) Get screened for STIs, including gonorrhea and chlamydia, if: You are sexually active and are younger than 30 years of age. You are older than 30 years of age and your health care provider tells you that you are at risk for this type of infection. Your sexual activity has changed since you were last screened, and you are at increased risk for chlamydia or gonorrhea. Ask your health care provider if you are at risk. Ask your health care provider about whether you are at high risk for HIV. Your health care provider may recommend a prescription medicine to help   prevent HIV infection. If you choose to take medicine to prevent HIV, you should first get tested for HIV. You should then be tested every 3 months for as long as you are taking the medicine. Pregnancy If you are about to stop having your period (premenopausal) and you may become pregnant, seek counseling before you get  pregnant. Take 400 to 800 micrograms (mcg) of folic acid every day if you become pregnant. Ask for birth control (contraception) if you want to prevent pregnancy. Osteoporosis and menopause Osteoporosis is a disease in which the bones lose minerals and strength with aging. This can result in bone fractures. If you are 65 years old or older, or if you are at risk for osteoporosis and fractures, ask your health care provider if you should: Be screened for bone loss. Take a calcium or vitamin D supplement to lower your risk of fractures. Be given hormone replacement therapy (HRT) to treat symptoms of menopause. Follow these instructions at home: Lifestyle Do not use any products that contain nicotine or tobacco, such as cigarettes, e-cigarettes, and chewing tobacco. If you need help quitting, ask your health care provider. Do not use street drugs. Do not share needles. Ask your health care provider for help if you need support or information about quitting drugs. Alcohol use Do not drink alcohol if: Your health care provider tells you not to drink. You are pregnant, may be pregnant, or are planning to become pregnant. If you drink alcohol: Limit how much you use to 0-1 drink a day. Limit intake if you are breastfeeding. Be aware of how much alcohol is in your drink. In the [place] one drink equals one 12 oz bottle of beer (355 mL), one 5 oz glass of wine (148 mL), or one 1 oz glass of hard liquor (44 mL). General instructions Schedule regular health, dental, and eye exams. Stay current with your vaccines. Tell your health care provider if: You often feel depressed. You have ever been abused or do not feel safe at home. Summary Adopting a healthy lifestyle and getting preventive care are important in promoting health and wellness. Follow your health care provider's instructions about healthy diet, exercising, and getting tested or screened for diseases. Follow your health care provider's  instructions on monitoring your cholesterol and blood pressure. This information is not intended to replace advice given to you by your health care provider. Make sure you discuss any questions you have with your healthcare provider. Document Revised: 12/20/2017 Document Reviewed: 12/20/2017 Elsevier Patient Education  2022 [employer]vier Inc.  

## 2020-06-30 NOTE — Assessment & Plan Note (Signed)
Avoid foods that trigger your reflux Ok to continue Tums OTC

## 2020-06-30 NOTE — Assessment & Plan Note (Signed)
Encouraged her to oral iron as prescribed CBC today

## 2020-07-01 LAB — COMPLETE METABOLIC PANEL WITH GFR
AG Ratio: 1.8 (calc) (ref 1.0–2.5)
ALT: 10 U/L (ref 6–29)
AST: 14 U/L (ref 10–30)
Albumin: 4.9 g/dL (ref 3.6–5.1)
Alkaline phosphatase (APISO): 54 U/L (ref 31–125)
BUN: 9 mg/dL (ref 7–25)
CO2: 24 mmol/L (ref 20–32)
Calcium: 9.7 mg/dL (ref 8.6–10.2)
Chloride: 101 mmol/L (ref 98–110)
Creat: 0.55 mg/dL (ref 0.50–1.10)
GFR, Est African American: 147 mL/min/{1.73_m2} (ref >=60)
GFR, Est Non African American: 127 mL/min/{1.73_m2} (ref >=60)
Globulin: 2.8 g/dL (calc) (ref 1.9–3.7)
Glucose, Bld: 85 mg/dL (ref 65–99)
Potassium: 4 mmol/L (ref 3.5–5.3)
Sodium: 135 mmol/L (ref 135–146)
Total Bilirubin: 1.3 mg/dL — ABNORMAL HIGH (ref 0.2–1.2)
Total Protein: 7.7 g/dL (ref 6.1–8.1)

## 2020-07-01 LAB — LIPID PANEL
Cholesterol: 204 mg/dL — ABNORMAL HIGH (ref <200)
HDL: 49 mg/dL — ABNORMAL LOW (ref >=50)
LDL Cholesterol (Calc): 129 mg/dL (calc) — ABNORMAL HIGH
Non-HDL Cholesterol (Calc): 155 mg/dL (calc) — ABNORMAL HIGH (ref <130)
Total CHOL/HDL Ratio: 4.2 (calc) (ref <5.0)
Triglycerides: 146 mg/dL (ref <150)

## 2020-07-01 LAB — CBC
HCT: 38.2 % (ref 35.0–45.0)
Hemoglobin: 12.1 g/dL (ref 11.7–15.5)
MCH: 28.1 pg (ref 27.0–33.0)
MCHC: 31.7 g/dL — ABNORMAL LOW (ref 32.0–36.0)
MCV: 88.8 fL (ref 80.0–100.0)
MPV: 10.8 fL (ref 7.5–12.5)
Platelets: 281 10*3/uL (ref 140–400)
RBC: 4.3 10*6/uL (ref 3.80–5.10)
RDW: 12.8 % (ref 11.0–15.0)
WBC: 6 10*3/uL (ref 3.8–10.8)

## 2020-07-01 LAB — HEMOGLOBIN A1C
Hgb A1c MFr Bld: 5.5 % of total Hgb (ref <5.7)
Mean Plasma Glucose: 111 mg/dL
eAG (mmol/L): 6.2 mmol/L

## 2020-07-01 LAB — HEPATITIS C ANTIBODY
Hepatitis C Ab: NONREACTIVE
SIGNAL TO CUT-OFF: 0.01 (ref <1.00)

## 2020-07-01 LAB — RPR: RPR Ser Ql: NONREACTIVE

## 2020-07-01 LAB — HIV ANTIBODY (ROUTINE TESTING W REFLEX): HIV 1&2 Ab, 4th Generation: NONREACTIVE

## 2020-07-02 LAB — URINE CYTOLOGY ANCILLARY ONLY
Chlamydia: NEGATIVE
Comment: NEGATIVE
Comment: NEGATIVE
Comment: NORMAL
Neisseria Gonorrhea: NEGATIVE
Trichomonas: NEGATIVE

## 2020-07-02 NOTE — Addendum Note (Signed)
Addended by: Lorre Munroe on: 07/02/2020 11:42 AM   Modules accepted: Orders

## 2020-09-10 ENCOUNTER — Encounter: Payer: Self-pay | Admitting: Internal Medicine

## 2020-09-15 NOTE — Telephone Encounter (Signed)
I attempted to contact the patient, no answer. LMOM to return my call at her convenience.

## 2020-09-15 NOTE — Telephone Encounter (Signed)
Copied from CRM [id number]. Topic: General - Other >> Sep 10, 2020  8:40 AM Jaquita Rector A wrote: Reason for CRM: Patient called in asking to speak to [facility] nurse asking for a call back states that it has to do with DOS 06/30/20. Can be reached at Ph#  [phone]

## 2020-11-27 ENCOUNTER — Telehealth: Payer: 59 | Admitting: Physician Assistant

## 2020-11-27 DIAGNOSIS — B354 Tinea corporis: Secondary | ICD-10-CM

## 2020-11-27 MED ORDER — NYSTATIN-TRIAMCINOLONE 100000-0.1 UNIT/GM-% EX OINT: 1.0000 "application " | TOPICAL_OINTMENT | Freq: Two times a day (BID) | CUTANEOUS | 0 refills | Status: DC

## 2020-11-27 MED ORDER — FLUCONAZOLE 150 MG PO TABS: 150.0000 mg | ORAL_TABLET | Freq: Every day | ORAL | 0 refills | Status: DC

## 2020-11-27 NOTE — Patient Instructions (Signed)
Wendy Mora, thank you for joining Wendy Loveless, PA-C for today's virtual visit.  While this provider is not your primary care provider (PCP), if your PCP is located in our provider database this encounter information will be shared with them immediately following your visit.  Consent: (Patient) Wendy Mora provided verbal consent for this virtual visit at the beginning of the encounter.  Current Medications:  Current Outpatient Medications:    fluconazole (DIFLUCAN) 150 MG tablet, Take 1 tablet (150 mg total) by mouth daily., Disp: 7 tablet, Rfl: 0   nystatin-triamcinolone ointment (MYCOLOG), Apply 1 application topically 2 (two) times daily., Disp: 30 g, Rfl: 0   ferrous sulfate 325 (65 FE) MG EC tablet, Take 325 mg by mouth daily with breakfast., Disp: , Rfl:    Specialty Vitamins Products (COLLAGEN ULTRA) CAPS, Take 1 capsule by mouth daily as needed., Disp: , Rfl:    Medications ordered in this encounter:  Meds ordered this encounter  Medications   nystatin-triamcinolone ointment (MYCOLOG)    Sig: Apply 1 application topically 2 (two) times daily.    Dispense:  30 g    Refill:  0    Order Specific Question:   Supervising Provider    Answer:   MILLER, BRIAN [[id number]]   fluconazole (DIFLUCAN) 150 MG tablet    Sig: Take 1 tablet (150 mg total) by mouth daily.    Dispense:  7 tablet    Refill:  0    Order Specific Question:   Supervising Provider    Answer:   Hyacinth Meeker, BRIAN [[id number]]     *If you need refills on other medications prior to your next appointment, please contact your pharmacy*  Follow-Up: Call back or seek an in-person evaluation if the symptoms worsen or if the condition fails to improve as anticipated.  Other Instructions Body Ringworm Body ringworm is an infection of the skin that often causes a ring-shaped rash. Body ringworm is also called tinea corporis. Body ringworm can affect any part of your skin. This condition is easily spread from person  to person (is very contagious). What are the causes? This condition is caused by fungi called dermatophytes. The condition develops when these fungi grow out of control on the skin. You can get this condition if you touch a person or animal that has it. You can also get it if you share any items with an infected person or pet. These include: Clothing, bedding, and towels. Brushes or combs. Gym equipment. Any other object that has the fungus on it. What increases the risk? You are more likely to develop this condition if you: Play sports that involve close physical contact, such as wrestling. Sweat a lot. Live in areas that are hot and humid. Use public showers. Have a weakened immune system. What are the signs or symptoms? Symptoms of this condition include: Itchy, raised red spots and bumps. Red scaly patches. A ring-shaped rash. The rash may have: A clear center. Scales or red bumps at its center. Redness near its borders. Dry and scaly skin on or around it. How is this diagnosed? This condition can usually be diagnosed with a skin exam. A skin scraping may be taken from the affected area and examined under a microscope to see if the fungus is present. How is this treated? This condition may be treated with: An antifungal cream or ointment. An antifungal shampoo. Antifungal medicines. These may be prescribed if your ringworm: Is severe. Keeps coming back. Lasts a long time. Follow these  instructions at home: Take over-the-counter and prescription medicines only as told by your health care provider. If you were given an antifungal cream or ointment: Use it as told by your health care provider. Wash the infected area and dry it completely before applying the cream or ointment. If you were given an antifungal shampoo: Use it as told by your health care provider. Leave the shampoo on your body for 3-5 minutes before rinsing. While you have a rash: Wear loose clothing to stop  clothes from rubbing and irritating it. Wash or change your bed sheets every night. Disinfect or throw out items that may be infected. Wash clothes and bed sheets in hot water. Wash your hands often with soap and water. If soap and water are not available, use hand sanitizer. If your pet has the same infection, take your pet to see a veterinarian for treatment. How is this prevented? Take a bath or shower every day and after every time you work out or play sports. Dry your skin completely after bathing. Wear sandals or shoes in public places and showers. Change your clothes every day. Wash athletic clothes after each use. Do not share personal items with others. Avoid touching red patches of skin on other people. Avoid touching pets that have bald spots. If you touch an animal that has a bald spot, wash your hands. Contact a health care provider if: Your rash continues to spread after 7 days of treatment. Your rash is not gone in 4 weeks. The area around your rash gets red, warm, tender, and swollen. Summary Body ringworm is an infection of the skin that often causes a ring-shaped rash. This condition is easily spread from person to person (is very contagious). This condition may be treated with antifungal cream or ointment, antifungal shampoo, or antifungal medicines. Take over-the-counter and prescription medicines only as told by your health care provider. This information is not intended to replace advice given to you by your health care provider. Make sure you discuss any questions you have with your health care provider. Document Revised: 08/25/2017 Document Reviewed: 08/25/2017 Elsevier Patient Education  2022 [employer].    If you have been instructed to have an in-person evaluation today at a local Urgent Care facility, please use the link below. It will take you to a list of all of our available [facility], including address, phone number and hours of  operation. Please do not delay care.  [facility]  If you or a family member do not have a primary care provider, use the link below to schedule a visit and establish care. When you choose a [facility] primary care physician or advanced practice provider, you gain a long-term partner in health. Find a Primary Care Provider  Learn more about [facility]'s in-office and virtual care options:  - Get Care Now

## 2020-11-27 NOTE — Progress Notes (Signed)
Virtual Visit Consent   Wendy Mora, you are scheduled for a virtual visit with a [facility] provider today.     Just as with appointments in the office, your consent must be obtained to participate.  Your consent will be active for this visit and any virtual visit you may have with one of our providers in the next 365 days.     If you have a MyChart account, a copy of this consent can be sent to you electronically.  All virtual visits are billed to your insurance company just like a traditional visit in the office.    As this is a virtual visit, video technology does not allow for your provider to perform a traditional examination.  This may limit your provider's ability to fully assess your condition.  If your provider identifies any concerns that need to be evaluated in person or the need to arrange testing (such as labs, EKG, etc.), we will make arrangements to do so.     Although advances in technology are sophisticated, we cannot ensure that it will always work on either your end or our end.  If the connection with a video visit is poor, the visit may have to be switched to a telephone visit.  With either a video or telephone visit, we are not always able to ensure that we have a secure connection.     I need to obtain your verbal consent now.   Are you willing to proceed with your visit today?    Wendy Mora has provided verbal consent on 11/27/2020 for a virtual visit (video or telephone).   [name], PA-C   Date: 11/27/2020 2:42 PM   Virtual Visit via Video Note   I, [name], connected with  Wendy Mora  ([id number], 20-Mar-1990) on 11/27/20 at  2:30 PM EST by a video-enabled telemedicine application and verified that I am speaking with the correct person using two identifiers.  Location: Patient: Virtual Visit Location Patient: Home Provider: Virtual Visit Location Provider: Home Office   I discussed the limitations of evaluation and  management by telemedicine and the availability of in person appointments. The patient expressed understanding and agreed to proceed.    History of Present Illness: Wendy Mora is a 30 y.o. who identifies as a female who was assigned female at birth, and is being seen today for rash.  HPI: Rash This is a new problem. The affected locations include the left axilla and right axilla. The rash is characterized by redness, swelling, itchiness and burning. Associated with: most likely from lifejacket used during water sports in [place] while on vacation. Pertinent negatives include no congestion, cough, diarrhea, fatigue, fever or joint pain. Past treatments include antihistamine and topical steroids (betamethazone and fluconazole; used cortisone cream initially and made it spread). The treatment provided mild relief.     Problems:  Patient Active Problem List   Diagnosis Date Noted   Iron deficiency anemia 06/30/2020   GERD (gastroesophageal reflux disease) 06/30/2020   Anxiety 12/09/2019    Allergies:  Allergies  Allergen Reactions   Mango Flavor Rash   Medications:  Current Outpatient Medications:    fluconazole (DIFLUCAN) 150 MG tablet, Take 1 tablet (150 mg total) by mouth daily., Disp: 7 tablet, Rfl: 0   nystatin-triamcinolone ointment (MYCOLOG), Apply 1 application topically 2 (two) times daily., Disp: 30 g, Rfl: 0   ferrous sulfate 325 (65 FE) MG EC tablet, Take 325 mg by mouth daily with breakfast., Disp: ,  Rfl:    Specialty Vitamins Products (COLLAGEN ULTRA) CAPS, Take 1 capsule by mouth daily as needed., Disp: , Rfl:   Observations/Objective: Patient is well-developed, well-nourished in no acute distress.  Resting comfortably at home.  Head is normocephalic, atraumatic.  No labored breathing.  Speech is clear and coherent with logical content.  Patient is alert and oriented at baseline.    Assessment and Plan: 1. Tinea corporis - nystatin-triamcinolone ointment  (MYCOLOG); Apply 1 application topically 2 (two) times daily.  Dispense: 30 g; Refill: 0 - fluconazole (DIFLUCAN) 150 MG tablet; Take 1 tablet (150 mg total) by mouth daily.  Dispense: 7 tablet; Refill: 0  - Suspect tinea corporis from life jacket exposure - Will treat with Mycolog ointment and diflucan - Seek further evaluation if not improving or worsening  Follow Up Instructions: I discussed the assessment and treatment plan with the patient. The patient was provided an opportunity to ask questions and all were answered. The patient agreed with the plan and demonstrated an understanding of the instructions.  A copy of instructions were sent to the patient via MyChart unless otherwise noted below.    The patient was advised to call back or seek an in-person evaluation if the symptoms worsen or if the condition fails to improve as anticipated.  Time:  I spent 11 minutes with the patient via telehealth technology discussing the above problems/concerns.    [name], PA-C

## 2021-03-17 ENCOUNTER — Telehealth: Payer: 59 | Admitting: Physician Assistant

## 2021-03-17 DIAGNOSIS — J069 Acute upper respiratory infection, unspecified: Secondary | ICD-10-CM | POA: Diagnosis not present

## 2021-03-17 MED ORDER — BENZONATATE 100 MG PO CAPS: 100.0000 mg | ORAL_CAPSULE | Freq: Three times a day (TID) | ORAL | 0 refills | Status: AC

## 2021-03-17 MED ORDER — FLUTICASONE PROPIONATE 50 MCG/ACT NA SUSP: 2.0000 | Freq: Every day | NASAL | 0 refills | Status: DC

## 2021-03-17 NOTE — Progress Notes (Signed)

## 2021-05-10 ENCOUNTER — Other Ambulatory Visit: Payer: Self-pay

## 2021-05-10 ENCOUNTER — Emergency Department: Payer: 59

## 2021-05-10 ENCOUNTER — Emergency Department
Admission: EM | Admit: 2021-05-10 | Discharge: 2021-05-10 | Disposition: A | Discharge status: Home / Self Care | Payer: 59 | Attending: Emergency Medicine | Admitting: Emergency Medicine

## 2021-05-10 DIAGNOSIS — R519 Headache, unspecified: Secondary | ICD-10-CM | POA: Diagnosis not present

## 2021-05-10 LAB — CBC WITH DIFFERENTIAL/PLATELET
Abs Immature Granulocytes: 0.01 10*3/uL (ref 0.00–0.07)
Basophils Absolute: 0.1 10*3/uL (ref 0.0–0.1)
Basophils Relative: 2 %
Eosinophils Absolute: 0.2 10*3/uL (ref 0.0–0.5)
Eosinophils Relative: 3 %
HCT: 36 % (ref 36.0–46.0)
Hemoglobin: 11.7 g/dL — ABNORMAL LOW (ref 12.0–15.0)
Immature Granulocytes: 0 %
Lymphocytes Relative: 37 %
Lymphs Abs: 2.5 10*3/uL (ref 0.7–4.0)
MCH: 28.3 pg (ref 26.0–34.0)
MCHC: 32.5 g/dL (ref 30.0–36.0)
MCV: 87.2 fL (ref 80.0–100.0)
Monocytes Absolute: 0.4 10*3/uL (ref 0.1–1.0)
Monocytes Relative: 6 %
Neutro Abs: 3.5 10*3/uL (ref 1.7–7.7)
Neutrophils Relative %: 52 %
Platelets: 311 10*3/uL (ref 150–400)
RBC: 4.13 MIL/uL (ref 3.87–5.11)
RDW: 12.3 % (ref 11.5–15.5)
WBC: 6.8 10*3/uL (ref 4.0–10.5)
nRBC: 0 % (ref 0.0–0.2)

## 2021-05-10 LAB — COMPREHENSIVE METABOLIC PANEL
ALT: 13 U/L (ref 0–44)
AST: 17 U/L (ref 15–41)
Albumin: 4.3 g/dL (ref 3.5–5.0)
Alkaline Phosphatase: 54 U/L (ref 38–126)
Anion gap: 13 (ref 5–15)
BUN: 10 mg/dL (ref 6–20)
CO2: 24 mmol/L (ref 22–32)
Calcium: 9.1 mg/dL (ref 8.9–10.3)
Chloride: 98 mmol/L (ref 98–111)
Creatinine, Ser: 0.53 mg/dL (ref 0.44–1.00)
GFR, Estimated: >60 mL/min (ref >60)
Glucose, Bld: 93 mg/dL (ref 70–99)
Potassium: 3.9 mmol/L (ref 3.5–5.1)
Sodium: 135 mmol/L (ref 135–145)
Total Bilirubin: 0.7 mg/dL (ref 0.3–1.2)
Total Protein: 8.3 g/dL — ABNORMAL HIGH (ref 6.5–8.1)

## 2021-05-10 LAB — URINALYSIS, ROUTINE W REFLEX MICROSCOPIC
Bilirubin Urine: NEGATIVE
Glucose, UA: NEGATIVE mg/dL
Hgb urine dipstick: NEGATIVE
Ketones, ur: NEGATIVE mg/dL
Leukocytes,Ua: NEGATIVE
Nitrite: NEGATIVE
Protein, ur: NEGATIVE mg/dL
Specific Gravity, Urine: 1.015 (ref 1.005–1.030)
pH: 7 (ref 5.0–8.0)

## 2021-05-10 MED ORDER — SODIUM CHLORIDE 0.9 % IV BOLUS
1000.0000 mL | Freq: Once | INTRAVENOUS | Status: AC
  Administered 2021-05-10: 1000 mL via INTRAVENOUS

## 2021-05-10 MED ORDER — DIPHENHYDRAMINE HCL 50 MG/ML IJ SOLN
50.0000 mg | Freq: Once | INTRAMUSCULAR | Status: AC
  Administered 2021-05-10: 50 mg via INTRAVENOUS
  Filled 2021-05-10: qty 1

## 2021-05-10 MED ORDER — METOCLOPRAMIDE HCL 5 MG/ML IJ SOLN
10.0000 mg | Freq: Once | INTRAMUSCULAR | Status: AC
  Administered 2021-05-10: 10 mg via INTRAVENOUS
  Filled 2021-05-10: qty 2

## 2021-05-10 NOTE — ED Provider Notes (Signed)
? ?[facility] ?Provider Note ? ?Patient Contact: 6:49 PM (approximate) ? ? ?History  ? ?Headache ? ? ?HPI ? ?Wendy Mora is a 31 y.o. female presents to the emergency department with an atypical pulsating headache for the past 3 days.  Patient states that she first noticed it when she was raking in the yard.  She denies associated changes in vision or dizziness.  No nausea, vomiting or abdominal pain.  Last eye exam was a year ago patient states that she has never had a similar migraine.  No new medication changes.  Patient is unsure how much water she drinks today. ? ?  ? ? ?Physical Exam  ? ?Triage Vital Signs: ?ED Triage Vitals  ?Enc Vitals Group  ?   BP 05/10/21 1625 128/74  ?   Pulse Rate 05/10/21 1625 86  ?   Resp 05/10/21 1625 18  ?   Temp 05/10/21 1625 98.4 ?F (36.9 ?C)  ?   Temp Source 05/10/21 1625 Oral  ?   SpO2 05/10/21 1625 100 %  ?   Weight 05/10/21 1625 153 lb (69.4 kg)  ?   Height 05/10/21 1625 5\' 6"  (1.676 m)  ?   Head Circumference --   ?   Peak Flow --   ?   Pain Score 05/10/21 1628 6  ?   Pain Loc --   ?   Pain Edu? --   ?   Excl. in GC? --   ? ? ?Most recent vital signs: ?Vitals:  ? 05/10/21 1625 05/10/21 1915  ?BP: 128/74 110/66  ?Pulse: 86 72  ?Resp: 18 18  ?Temp: 98.4 ?F (36.9 ?C)   ?SpO2: 100% 100%  ? ? ? ?General: Alert and in no acute distress. ?Eyes:  PERRL. EOMI. ?Head: No acute traumatic findings ?ENT: ?     Ears: Tms are pearly.  ?     Nose: No congestion/rhinnorhea. ?     Mouth/Throat: Mucous membranes are moist.  ?Neck: No stridor. No cervical spine tenderness to palpation. ?Hematological/Lymphatic/Immunilogical: No cervical lymphadenopathy. ?Cardiovascular:  Good peripheral perfusion ?Respiratory: Normal respiratory effort without tachypnea or retractions. Lungs CTAB. Good air entry to the bases with no decreased or absent breath sounds. ?Gastrointestinal: Bowel sounds ?4 quadrants. Soft and nontender to palpation. No guarding or rigidity. No palpable  masses. No distention. No CVA tenderness.**} ?Musculoskeletal: Full range of motion to all extremities.  ?Neurologic:  No gross focal neurologic deficits are appreciated.  ?Skin:   No rash noted ?Other: ? ? ?ED Results / Procedures / Treatments  ? ?Labs ?(all labs ordered are listed, but only abnormal results are displayed) ?Labs Reviewed  ?CBC WITH DIFFERENTIAL/PLATELET - Abnormal; Notable for the following components:  ?    Result Value  ? Hemoglobin 11.7 (*)   ? All other components within normal limits  ?COMPREHENSIVE METABOLIC PANEL - Abnormal; Notable for the following components:  ? Total Protein 8.3 (*)   ? All other components within normal limits  ?URINALYSIS, ROUTINE W REFLEX MICROSCOPIC - Abnormal; Notable for the following components:  ? Color, Urine YELLOW (*)   ? APPearance CLEAR (*)   ? All other components within normal limits  ? ? ? ? ? ?RADIOLOGY ? ?I personally viewed and evaluated these images as part of my medical decision making, as well as reviewing the written report by the radiologist. ? ?ED Provider Interpretation: I personally reviewed CT head and agree with radiologist interpretation.  No acute abnormality visualized. ? ? ?  PROCEDURES: ? ?Critical Care performed: No ? ?Procedures ? ? ?MEDICATIONS ORDERED IN ED: ?Medications  ?sodium chloride 0.9 % bolus 1,000 mL (1,000 mLs Intravenous New Bag/Given 05/10/21 1914)  ?metoCLOPramide (REGLAN) injection 10 mg (10 mg Intravenous Given 05/10/21 1913)  ?diphenhydrAMINE (BENADRYL) injection 50 mg (50 mg Intravenous Given 05/10/21 1913)  ? ? ? ?IMPRESSION / MDM / ASSESSMENT AND PLAN / ED COURSE  ?I reviewed the triage vital signs and the nursing notes. ?             ?               ? ?Assessment and plan ?Headache ?31 year old female presents to the emergency department with an atypical headache. ? ?Vital signs are reassuring at triage.  On physical exam, patient was alert, active and nontoxic-appearing with no neurodeficits noted. ?  ?I personally  reviewed CT head which showed no acute abnormality.  CBC, CMP and urinalysis unremarkable.  Patient symptoms resolved with Reglan and Benadryl.  Recommended follow-up with primary care as needed. ? ?FINAL CLINICAL IMPRESSION(S) / ED DIAGNOSES  ? ?Final diagnoses:  ?Acute nonintractable headache, unspecified headache type  ? ? ? ?Rx / DC Orders  ? ?ED Discharge Orders   ? ? None  ? ?  ? ? ? ?Note:  This document was prepared using Dragon voice recognition software and may include unintentional dictation errors. ?  ?Orvil Feil, PA-C ?05/10/21 2010 ? ?  ?Gilles Chiquito, MD ?05/10/21 2302 ? ?

## 2021-05-10 NOTE — ED Triage Notes (Signed)
Pt c/o intermittent pounding HA for the past week or 2, states it will happened randomly and last a couple of hours some times.  ?

## 2022-01-12 ENCOUNTER — Encounter: Payer: Self-pay | Admitting: Dietician

## 2022-01-12 ENCOUNTER — Encounter: Payer: 59 | Attending: Internal Medicine | Admitting: Dietician

## 2022-01-12 VITALS — Ht 66.0 in | Wt 167.3 lb

## 2022-01-12 DIAGNOSIS — R635 Abnormal weight gain: Secondary | ICD-10-CM

## 2022-01-12 DIAGNOSIS — Z713 Dietary counseling and surveillance: Secondary | ICD-10-CM | POA: Diagnosis not present

## 2022-01-12 DIAGNOSIS — D509 Iron deficiency anemia, unspecified: Secondary | ICD-10-CM | POA: Diagnosis not present

## 2022-01-12 DIAGNOSIS — R739 Hyperglycemia, unspecified: Secondary | ICD-10-CM

## 2022-01-12 DIAGNOSIS — F419 Anxiety disorder, unspecified: Secondary | ICD-10-CM | POA: Insufficient documentation

## 2022-01-12 DIAGNOSIS — K219 Gastro-esophageal reflux disease without esophagitis: Secondary | ICD-10-CM | POA: Insufficient documentation

## 2022-01-12 DIAGNOSIS — Z6827 Body mass index (BMI) 27.0-27.9, adult: Secondary | ICD-10-CM | POA: Insufficient documentation

## 2022-01-12 NOTE — Progress Notes (Signed)
Medical Nutrition Therapy: Visit start time: 0830  end time: 0935 Assessment:   Referral Diagnosis: unintentional weight gain Other medical history/ diagnoses: history of iron deficiency anemia, anxiety, GERD Psychosocial issues/ stress concerns: history of high stress level/ anxiety  Medications, supplements: not taking any medications at this time   Preferred learning method:  Auditory Visual Hands-on    Current weight: 167.5lbs Height: 5'6" BMI: 27 Patient's personal weight goal: about 145lbs   Progress and evaluation:  Patient reports gain of about 20lbs since moving to [place] from [facility], she feels due to increased stress eating and less physical activity. Reports occasional binge eating, or urge to do so. She works remotely up to [employer], states she typically gets very focused on her work and does not stop to eat or move. She does get hunger symptoms but ignores. Stress level and emotional eating has improved recently per patient as she is adjusting to new home and schedule. Also recently finished college level classes. She has been trying to limit carb intake in the evenings on advice from personal trainer, increased intake of veg ie greens, decreasing sugar intake. Has resumed regular exercise with personal trainer, 3 times a week.  Recent labs (09/2021) indicate: HbA1C 6.0%; total cholesterol 198, HDL 42.5, LDL 123, Triglycerides 161. Food allergies: mango (rash) Special diet practices: none Mother has type 2 diabetes; brother also has diabetes and recent toe amputation. Patient seeks help with prevent elevated glucose and cholesterol; avoid stress eating; lose weight to her norm Next PCP appt is 03/2022   Dietary Intake:  Usual eating pattern includes 1-2 meals and 1-2 snacks per day. Dining out frequency: 1-2 meals per week. Who plans meals/ buys groceries? Self, spouse Who prepares meals? self  Breakfast: toast 4sl (currently cinnamon bread) with butter; air fried hash  browns, occ egg white/ egg muffin; often just drinks coffee latte with sweetener, but trying to decrease Snack: none Lunch: usually skips; trying to add protein ie tuna packet low sodium; mixed nuts; almond milk yogurt Snack: protein shake, trying to limit dairy so uses vegan Research scientist (physical sciences)) protein powder, fruit, kale-spinach blend, sometimes adds nuts.  Supper: salad with chicken breast; recently rice and Spanish stew with pumpkin, yucca, potatoes; usually rice/ occ quinoa, beans and protein ie chicken/ salmon/ tilapia + sometimes broccoli/ asparagus Snack: none Beverages: coffee in am; soda (reg coke mini) -- trying to decrease; BCAA electrolyte mix in water; some plain water 2 bottles of water  Physical activity: cardio and strength training 60 minutes, 3 times a week   Intervention:   Nutrition Care Education:   Basic nutrition: basic food groups; appropriate nutrient balance; appropriate meal and snack schedule; general nutrition guidelines    Weight control: identifying healthy weight; importance of low sugar and low fat choices; appropriate food portions and control strategies including avoiding eating from large container, measuring; estimated energy needs for weight loss at 1700-1800 kcal, provided guidance for 45% CHO, 25% pro, 30% fat; role of physical activity; controlling stress eating Advanced nutrition: pre-prepping meals and snacks; suitable protein sources Other: controlling carb intake and making healthy carb choices, eating at regular intervals to control blood sugar.  Other intervention notes: Patient has been making positive and appropriate changes to reduce health risk and control weight. She is motivated to continue.  Established additional goals for change with direction from patient. No follow up scheduled at this time; patient will schedule later if needed.   Nutritional Diagnosis:  [place]-2.2 Altered nutrition-related laboratory As related  to abnormal glucose control.   As evidenced by elevated HbA1C. [place]-3.4 Unintentional weight gain As related to stress, emotional eating, history of inadequate physical activity.  As evidenced by patient with current BMI of 27, working on lifestyle changes to promote weight loss and reduce health risk.   Education Materials given:  Museum/gallery conservator with food lists, sample meal pattern Tips for Managing Food Cravings Sample menus Snacking handout Visit summary with goals/ instructions   Learner/ who was taught:  Patient   Level of understanding: Verbalizes/ demonstrates competency   Demonstrated degree of understanding via:   Teach back Learning barriers: None  Willingness to learn/ readiness for change: Eager, change in progress   Monitoring and Evaluation:  Dietary intake, exercise, BG control, and body weight      follow up: prn

## 2022-01-12 NOTE — Patient Instructions (Signed)
Have something to eat every 3-5 hours during the day. Plan ahead and prepare to have healthy options close by and ready.  Limit added fats including butter and salad dressing, gradually reduce amounts used if needed. Measure out healthy portions of foods especially starches (fist size, which is 1 cup, or less).  Incorporate movement throughout the day, especially on non-workout days.

## 2022-06-25 ENCOUNTER — Telehealth: Payer: 59

## 2022-06-25 ENCOUNTER — Ambulatory Visit
Admission: EM | Admit: 2022-06-25 | Discharge: 2022-06-25 | Disposition: A | Discharge status: Home / Self Care | Payer: 59 | Attending: Emergency Medicine | Admitting: Emergency Medicine

## 2022-06-25 DIAGNOSIS — S30860A Insect bite (nonvenomous) of lower back and pelvis, initial encounter: Secondary | ICD-10-CM | POA: Diagnosis not present

## 2022-06-25 DIAGNOSIS — W57XXXA Bitten or stung by nonvenomous insect and other nonvenomous arthropods, initial encounter: Secondary | ICD-10-CM | POA: Diagnosis not present

## 2022-06-25 MED ORDER — MUPIROCIN 2 % EX OINT: 1.0000 | TOPICAL_OINTMENT | Freq: Two times a day (BID) | CUTANEOUS | 0 refills | Status: AC

## 2022-06-25 NOTE — Discharge Instructions (Addendum)
Apply mupirocin ointment to left buttock as directed.  May take over-the-counter allergy med of choice for symptom management(itching).  Follow-up with PCP if symptom worsens next week, return as needed.

## 2022-06-25 NOTE — ED Provider Notes (Signed)
[facility]    CSN: [id number] Arrival date & time: 06/25/22  1407      History   Chief Complaint Chief Complaint  Patient presents with   Insect Bite    HPI Cerria Ronchetti is a 32 y.o. female.   32 year old, Sadee Ruppe, presents to urgent care for evaluation of several bug bites to left buttock x 1 week.  Patient states she has not messed with the area other than to wash it, mostly to check to make sure is not infected.  Reports mild soreness at the area.  No fever, no nausea ,no vomiting.  No treatment tried prior to arrival.  The history is provided by the patient. No language interpreter was used.    Past Medical History:  Diagnosis Date   Anemia    Anxiety    Hyperlipidemia     Patient Active Problem List   Diagnosis Date Noted   Iron deficiency anemia 06/30/2020   GERD (gastroesophageal reflux disease) 06/30/2020   Anxiety 12/09/2019    Past Surgical History:  Procedure Laterality Date   COLONOSCOPY WITH PROPOFOL N/A 02/17/2020   Procedure: COLONOSCOPY WITH PROPOFOL;  Surgeon: Toney Reil, MD;  Location: [facility] ENDOSCOPY;  Service: Gastroenterology;  Laterality: N/A;   NO PAST SURGERIES      OB History   No obstetric history on file.      Home Medications    Prior to Admission medications   Medication Sig Start Date End Date Taking? Authorizing Provider  mupirocin ointment (BACTROBAN) 2 % Apply 1 Application topically 2 (two) times daily for 7 days. Left buttock 06/25/22 07/02/22 Yes Tanmay Halteman, Para March, NP  ferrous sulfate 325 (65 FE) MG EC tablet Take 325 mg by mouth daily with breakfast.    [provider]  fluconazole (DIFLUCAN) 150 MG tablet Take 1 tablet (150 mg total) by mouth daily. 11/27/20   [name], PA-C  fluticasone (FLONASE) 50 MCG/ACT nasal spray Place 2 sprays into both nostrils daily. 03/17/21   Couture, Cortni S, PA-C  nystatin-triamcinolone ointment (MYCOLOG) Apply 1 application topically 2  (two) times daily. 11/27/20   [name], PA-C  Specialty Vitamins Products (COLLAGEN ULTRA) CAPS Take 1 capsule by mouth daily as needed.    [provider]    Family History Family History  Problem Relation Age of Onset   Diabetes Mother    Depression Mother    Thyroid disease Mother    Hyperlipidemia Mother    Hypertension Mother    Heart disease Father    Alcohol abuse Father    Diabetes Brother    Mental retardation Brother    Cancer Maternal Grandmother    Cancer Maternal Grandfather    Alzheimer's disease Paternal Grandmother    Obesity Brother    Bipolar disorder Brother    High Cholesterol Sister     Social History Social History   Tobacco Use   Smoking status: Never   Smokeless tobacco: Never  Vaping Use   Vaping Use: Never used  Substance Use Topics   Alcohol use: Yes    Comment: socially   Drug use: Never     Allergies   Mango flavor   Review of Systems Review of Systems  Constitutional:  Negative for fever.  Skin:  Positive for color change and wound.  All other systems reviewed and are negative.    Physical Exam Triage Vital Signs ED Triage Vitals  Enc Vitals Group     BP 06/25/22 1421 114/80  Pulse Rate 06/25/22 1421 88     Resp 06/25/22 1421 16     Temp 06/25/22 1421 98.4 F (36.9 C)     Temp Source 06/25/22 1421 Oral     SpO2 06/25/22 1421 98 %     Weight 06/25/22 1418 165 lb (74.8 kg)     Height 06/25/22 1418 5\' 6"  (1.676 m)     Head Circumference --      Peak Flow --      Pain Score 06/25/22 1418 1     Pain Loc --      Pain Edu? --      Excl. in GC? --    No data found.  Updated Vital Signs BP 114/80 (BP Location: Left Arm)   Pulse 88   Temp 98.4 F (36.9 C) (Oral)   Resp 16   Ht 5\' 6"  (1.676 m)   Wt 165 lb (74.8 kg)   LMP 06/01/2022   SpO2 98%   BMI 26.63 kg/m   Visual Acuity Right Eye Distance:   Left Eye Distance:   Bilateral Distance:    Right Eye Near:   Left Eye Near:     Bilateral Near:     Physical Exam Vitals and nursing note reviewed.  Skin:    General: Skin is warm.     Capillary Refill: Capillary refill takes less than 2 seconds.     Findings: Wound present. No rash.          Comments: Patient has 5 individual pinpoint scabbed areas to left buttock, consistent with recent insect bite, scabbing noted.  No erythema or drainage noted, no streaking  Neurological:     General: No focal deficit present.     Mental Status: She is alert and oriented to person, place, and time.     GCS: GCS eye subscore is 4. GCS verbal subscore is 5. GCS motor subscore is 6.  Psychiatric:        Attention and Perception: Attention normal.        Mood and Affect: Mood normal.        Speech: Speech normal.      UC Treatments / Results  Labs (all labs ordered are listed, but only abnormal results are displayed) Labs Reviewed - No data to display  EKG   Radiology No results found.  Procedures Procedures (including critical care time)  Medications Ordered in UC Medications - No data to display  Initial Impression / Assessment and Plan / UC Course  I have reviewed the triage vital signs and the nursing notes.  Pertinent labs & imaging results that were available during my care of the patient were reviewed by me and considered in my medical decision making (see chart for details).     Ddx: Insect bites, local reaction, allergies Final Clinical Impressions(s) / UC Diagnoses   Final diagnoses:  Insect bite, unspecified site, initial encounter     Discharge Instructions      Apply mupirocin ointment to left buttock as directed.  May take over-the-counter allergy med of choice for symptom management(itching).  Follow-up with PCP if symptom worsens next week, return as needed.     ED Prescriptions     Medication Sig Dispense Auth. Provider   mupirocin ointment (BACTROBAN) 2 % Apply 1 Application topically 2 (two) times daily for 7 days. Left  buttock 15 g Allon Costlow, Para March, NP      PDMP not reviewed this encounter.   Clancy Gourd, NP 06/25/22 1439

## 2022-06-25 NOTE — ED Notes (Signed)
110 

## 2022-06-25 NOTE — ED Triage Notes (Signed)
Pt reports bug bites to left butt cheek x one week. Now slightly discolored and sore.

## 2022-06-30 ENCOUNTER — Encounter: Payer: Self-pay | Admitting: Emergency Medicine

## 2022-06-30 ENCOUNTER — Other Ambulatory Visit: Payer: Self-pay

## 2022-06-30 ENCOUNTER — Emergency Department
Admission: EM | Admit: 2022-06-30 | Discharge: 2022-06-30 | Disposition: A | Discharge status: Home / Self Care | Payer: 59 | Attending: Emergency Medicine | Admitting: Emergency Medicine

## 2022-06-30 DIAGNOSIS — S30860A Insect bite (nonvenomous) of lower back and pelvis, initial encounter: Secondary | ICD-10-CM | POA: Insufficient documentation

## 2022-06-30 DIAGNOSIS — W57XXXA Bitten or stung by nonvenomous insect and other nonvenomous arthropods, initial encounter: Secondary | ICD-10-CM | POA: Insufficient documentation

## 2022-06-30 MED ORDER — SULFAMETHOXAZOLE-TRIMETHOPRIM 800-160 MG PO TABS: 1.0000 | ORAL_TABLET | Freq: Two times a day (BID) | ORAL | 0 refills | Status: DC

## 2022-06-30 NOTE — ED Notes (Addendum)
See triage note   Presents with possible insect bite to left buttock  States she has been seen and was placed on antibiotic ointment  No fever

## 2022-06-30 NOTE — ED Provider Notes (Signed)
   [facility] Provider Note    Event Date/Time   First MD Initiated Contact with Patient 06/30/22 [phone]     (approximate)   History   Insect Bite   HPI  Wendy Mora is a 32 y.o. female  with no significant past medical history and as listed in EMR presents to the emergency department for evaluation of insect bites to there right buttock that have been present for the past week. Marland Kitchen      Physical Exam   Triage Vital Signs: ED Triage Vitals  Enc Vitals Group     BP 06/30/22 0631 127/81     Pulse Rate 06/30/22 0631 82     Resp 06/30/22 0631 16     Temp 06/30/22 0631 98.3 F (36.8 C)     Temp Source 06/30/22 0631 Oral     SpO2 06/30/22 0631 100 %     Weight 06/30/22 0631 165 lb (74.8 kg)     Height 06/30/22 0631 5\' 6"  (1.676 m)     Head Circumference --      Peak Flow --      Pain Score 06/30/22 0637 7     Pain Loc --      Pain Edu? --      Excl. in GC? --     Most recent vital signs: Vitals:   06/30/22 0631  BP: 127/81  Pulse: 82  Resp: 16  Temp: 98.3 F (36.8 C)  SpO2: 100%    General: Awake, no distress.  CV:  Good peripheral perfusion.  Resp:  Normal effort.  Abd:  No distention.  Other:  Scabs noted on left buttock with surrounding erythema   ED Results / Procedures / Treatments   Labs (all labs ordered are listed, but only abnormal results are displayed) Labs Reviewed - No data to display   EKG  No   RADIOLOGY  Image and radiology report reviewed and interpreted by me. Radiology report consistent with the same.  Not indicated.  PROCEDURES:  Critical Care performed: No  Procedures   MEDICATIONS ORDERED IN ED:  Medications - No data to display   IMPRESSION / MDM / ASSESSMENT AND PLAN / ED COURSE   I have reviewed the triage note.  Differential diagnosis includes, but is not limited to, cellulitis, abscess  Patient's presentation is most consistent with acute illness / injury with system  symptoms.  32 year old presenting to the emergency department for evaluation of scabbed areas on her buttock that are not going away with mupirocin ointment.  Area has now become tender.  On exam, she does have 5 small scabbed areas and some erythema.  Plan will be to put her on Bactrim twice a day and have her follow up with primary care.      FINAL CLINICAL IMPRESSION(S) / ED DIAGNOSES   Final diagnoses:  Insect bite, unspecified site, initial encounter     Rx / DC Orders   ED Discharge Orders          Ordered    sulfamethoxazole-trimethoprim (BACTRIM DS) 800-160 MG tablet  2 times daily        06/30/22 0719             Note:  This document was prepared using Dragon voice recognition software and may include unintentional dictation errors.   Chinita Pester, FNP 06/30/22 1503    Sharman Cheek, MD 06/30/22 [phone]

## 2022-06-30 NOTE — ED Triage Notes (Signed)
Patient ambulatory to triage with steady gait, without difficulty or distress noted; pt reports noted "insect bites" to left buttock several wks ago; went to UC and rx mupirocin; st area persist and now having pain/itching to upper thigh as well; upon inspection, noted 5 small scabbed areas to left buttock

## 2022-10-31 IMAGING — CT CT HEAD W/O CM
4 series · 16 of 47 positions shown, 18 images · non-contrast
Comparison: None.

CLINICAL DATA: Intermittent headache for 2 weeks



[Series 2: head bone · axial · 0.41mm/px · z∈[-51,-23]mm · 3 of 70 slices shown]
[im 7/70  bone]
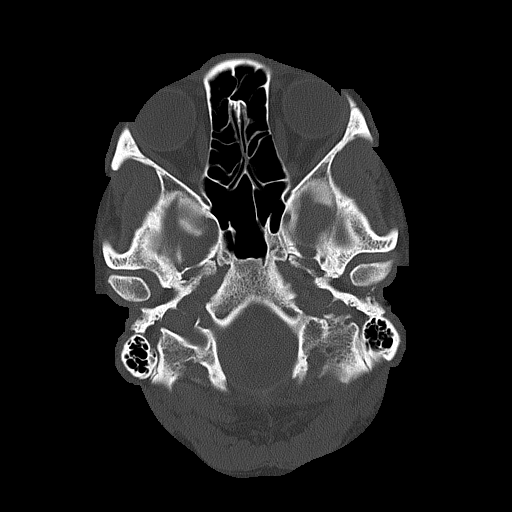
[im 14/70  bone]
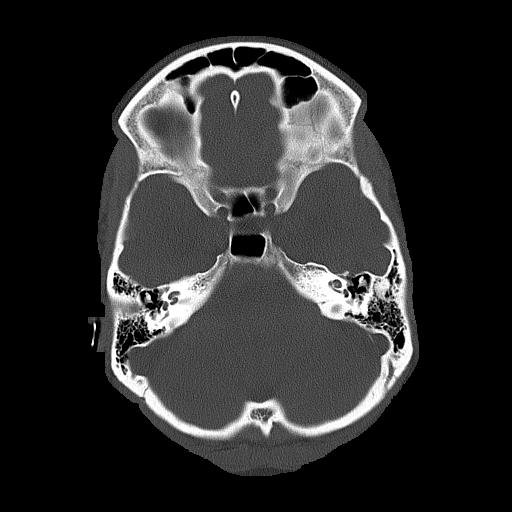
[im 21/70  bone]
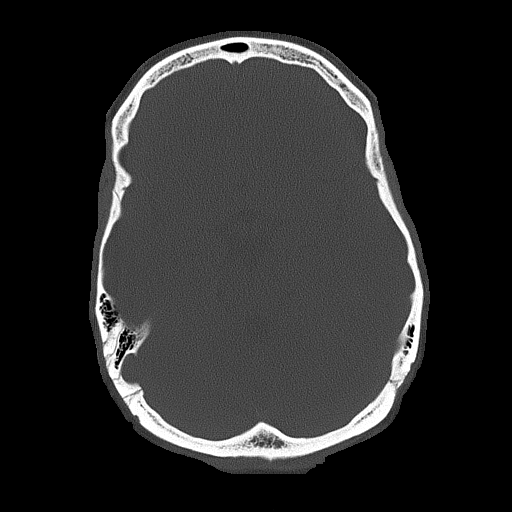

[Series 3: head wo · axial · 0.41mm/px · z∈[-48,+52]mm · 7 of 28 slices shown, 9 images]
[im 4/28  brain]
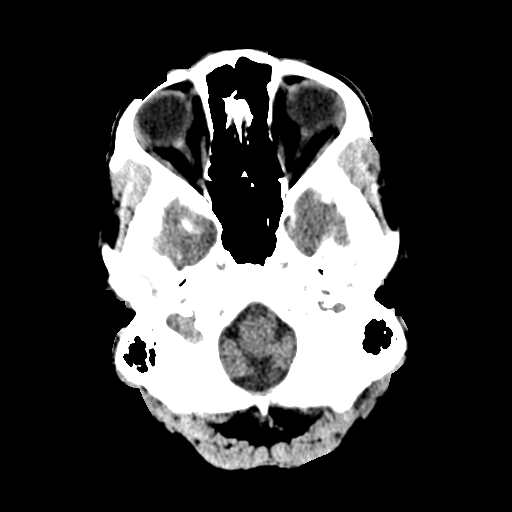
[im 4/28  bone]
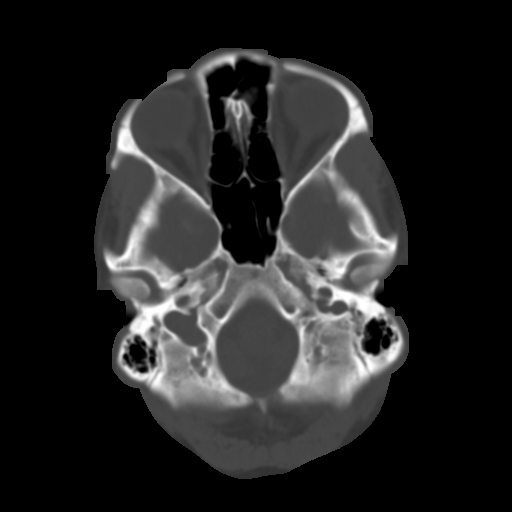
[im 7/28  brain]
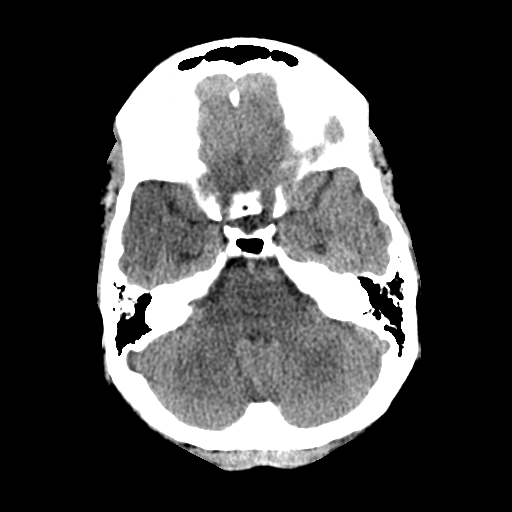
[im 11/28  brain]
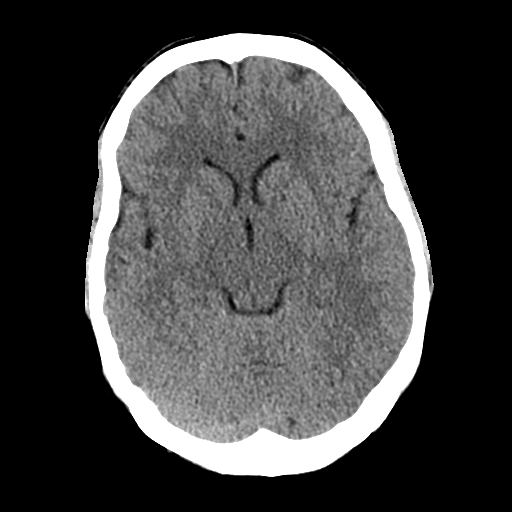
[im 14/28  brain]
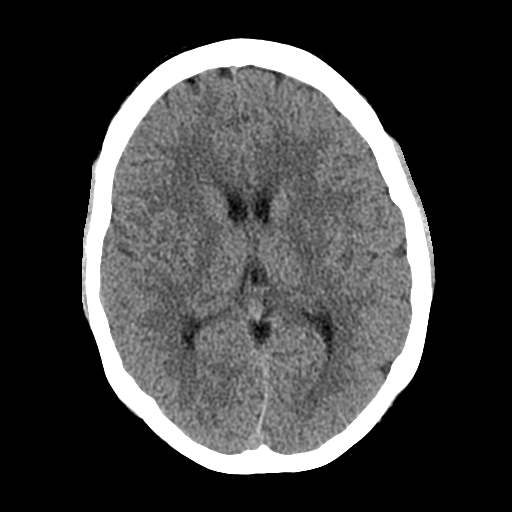
[im 17/28  brain]
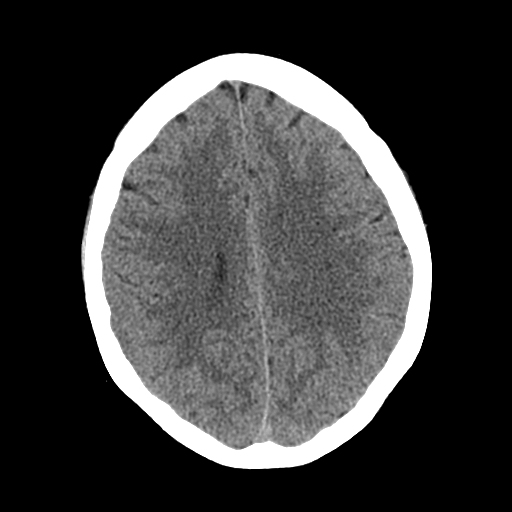
[im 17/28  bone]
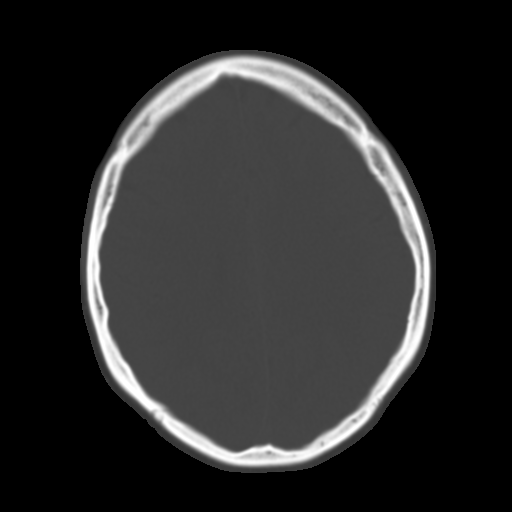
[im 21/28  brain]
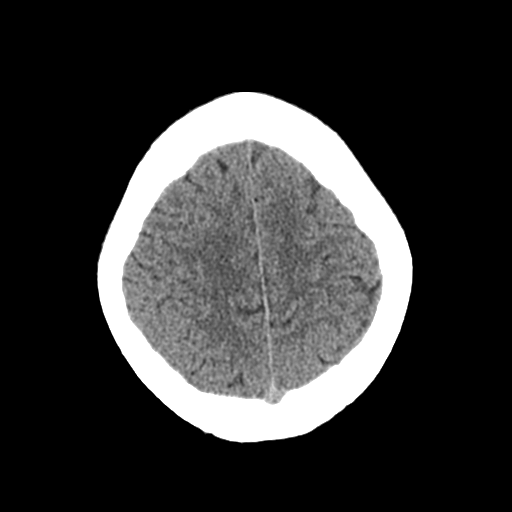
[im 24/28  brain]
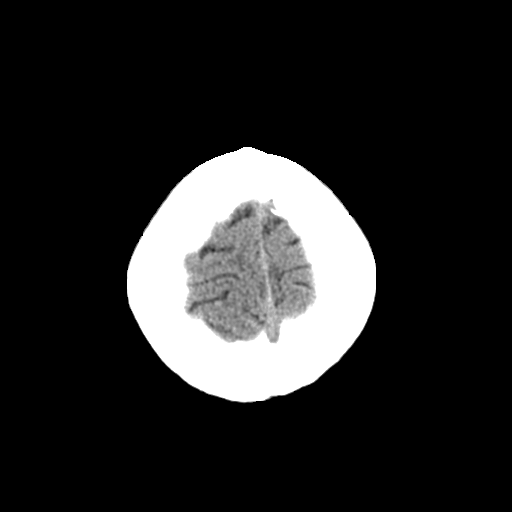

[Series 4: coronal soft tissue · coronal · 0.29mm/px · 3 of 65 slices shown]
[im 22/65  brain]
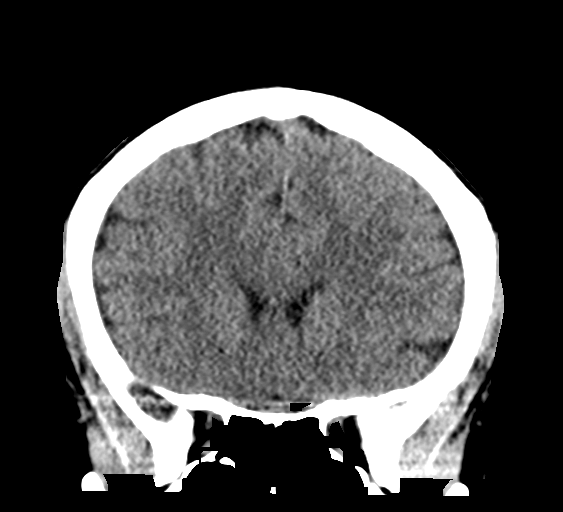
[im 29/65  brain]
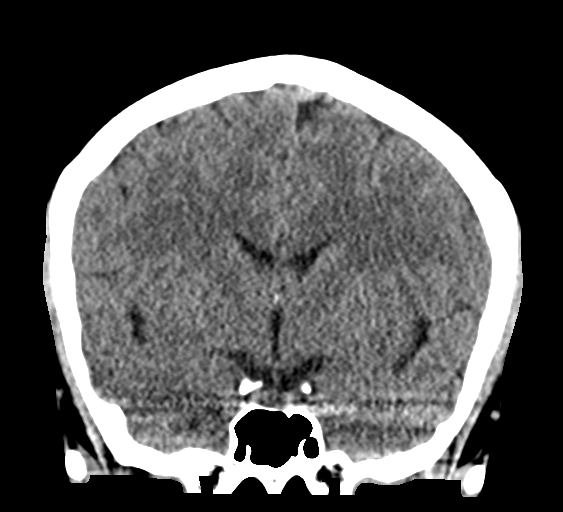
[im 36/65  brain]
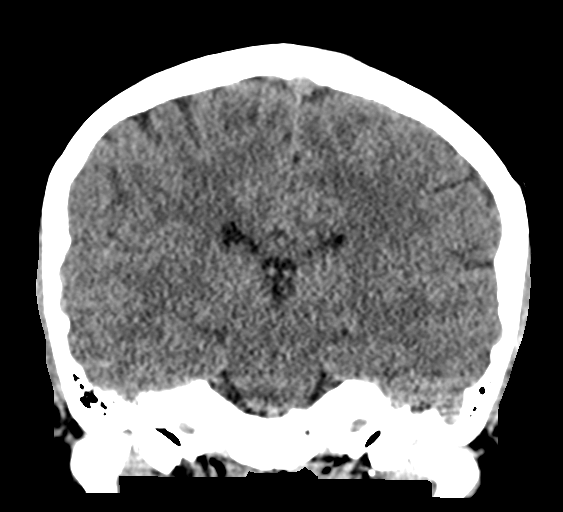

[Series 5: sagittal soft tissue · sagittal · 0.29mm/px · 3 of 56 slices shown]
[im 19/56  brain]
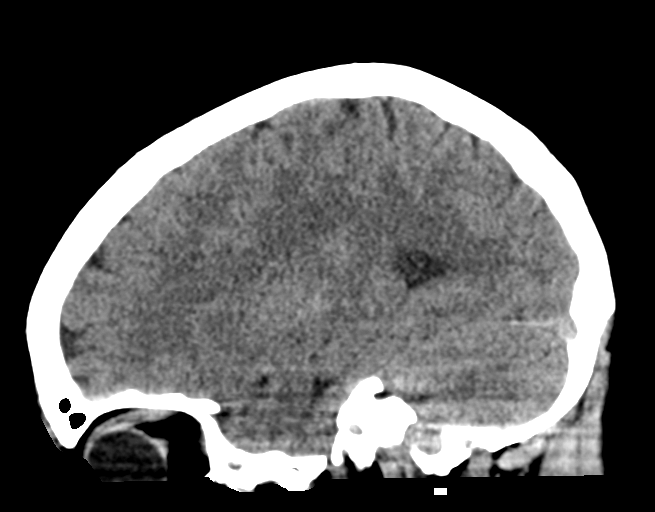
[im 28/56  brain]
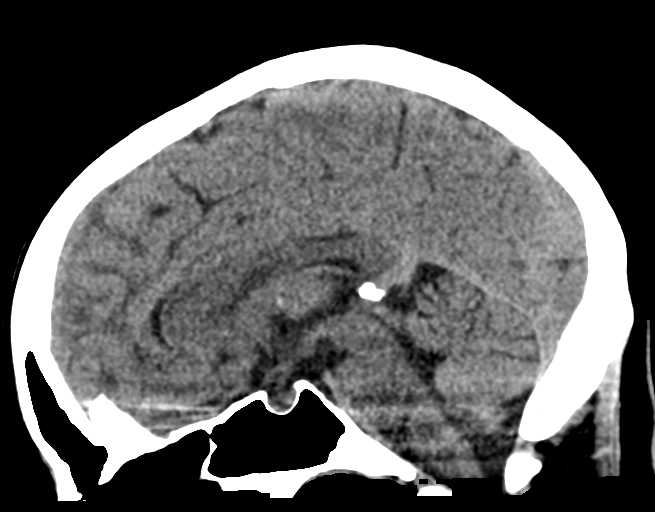
[im 37/56  brain]
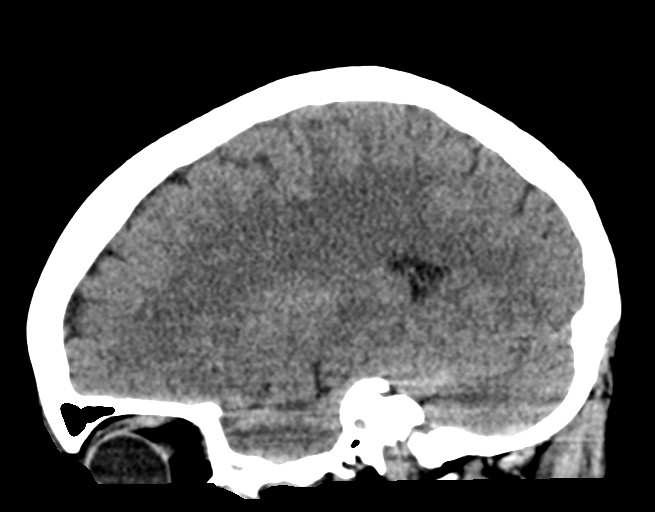

[16 of 47 positions shown; findings below may reference images not displayed]

FINDINGS: Brain: No acute infarct or hemorrhage. Lateral ventricles and
midline structures are unremarkable. No acute extra-axial fluid
collections. No mass effect.

Vascular: No hyperdense vessel or unexpected calcification.

Skull: Normal. Negative for fracture or focal lesion.

Sinuses/Orbits: No acute finding.

Other: None.
IMPRESSION: 1. No acute intracranial process.

## 2023-07-08 ENCOUNTER — Ambulatory Visit
Admission: EM | Admit: 2023-07-08 | Discharge: 2023-07-08 | Disposition: A | Discharge status: Home / Self Care | Attending: Family Medicine | Admitting: Family Medicine

## 2023-07-08 ENCOUNTER — Encounter: Payer: Self-pay | Admitting: Emergency Medicine

## 2023-07-08 DIAGNOSIS — R197 Diarrhea, unspecified: Secondary | ICD-10-CM | POA: Insufficient documentation

## 2023-07-08 MED ORDER — TINIDAZOLE 500 MG PO TABS: 2.0000 g | ORAL_TABLET | Freq: Every day | ORAL | 0 refills | Status: AC

## 2023-07-08 NOTE — ED Triage Notes (Signed)
 Patient c/o diarrhea for 3 days.  Patient denies N/V.  Patient reports some abdominal bloating and cramps.  Patient denies fevers.

## 2023-07-08 NOTE — Discharge Instructions (Signed)
 Return to the urgent care with your stool sample.  After returning your stool sample, take your antibiotics as prescribed for treatment of Giardia.

## 2023-07-08 NOTE — ED Provider Notes (Signed)
 [facility]    CSN: [id number] Arrival date & time: 07/08/23  0845      History   Chief Complaint Chief Complaint  Patient presents with   Diarrhea    HPI Sabel Hornbeck is a 33 y.o. female.   HPI  Leila presents for diarrhea.  She has been having 3 days of stinky diarrhea that floats with abdominal cramping. No fever, bright red blood or melena. Her partner doesn't have diarrhea. She has not had any recent antibiotics. Sister says that it smells like C. Diff.  She has not traveled outside the country recently.   Of note, her cat tested positive for Giardia.  She cleans her cat's stool in the litter box. Her cat was started on anti-parasitic.      Past Medical History:  Diagnosis Date   Anemia    Anxiety    Hyperlipidemia     Patient Active Problem List   Diagnosis Date Noted   Iron deficiency anemia 06/30/2020   GERD (gastroesophageal reflux disease) 06/30/2020   Anxiety 12/09/2019    Past Surgical History:  Procedure Laterality Date   COLONOSCOPY WITH PROPOFOL  N/A 02/17/2020   Procedure: COLONOSCOPY WITH PROPOFOL ;  Surgeon: Unk Corinn Skiff, MD;  Location: [facility] ENDOSCOPY;  Service: Gastroenterology;  Laterality: N/A;   NO PAST SURGERIES      OB History   No obstetric history on file.      Home Medications    Prior to Admission medications   Medication Sig Start Date End Date Taking? Authorizing Provider  tinidazole (TINDAMAX) 500 MG tablet Take 4 tablets (2,000 mg total) by mouth daily with breakfast for 1 dose. 07/08/23 07/09/23 Yes Khalaya Mcgurn, DO  ferrous sulfate 325 (65 FE) MG EC tablet Take 325 mg by mouth daily with breakfast.    [provider]    Family History Family History  Problem Relation Age of Onset   Diabetes Mother    Depression Mother    Thyroid disease Mother    Hyperlipidemia Mother    Hypertension Mother    Heart disease Father    Alcohol abuse Father    Diabetes Brother    Mental retardation  Brother    Cancer Maternal Grandmother    Cancer Maternal Grandfather    Alzheimer's disease Paternal Grandmother    Obesity Brother    Bipolar disorder Brother    High Cholesterol Sister     Social History Social History   Tobacco Use   Smoking status: Never   Smokeless tobacco: Never  Vaping Use   Vaping status: Never Used  Substance Use Topics   Alcohol use: Yes    Comment: socially   Drug use: Never     Allergies   Mango flavoring agent (non-screening)   Review of Systems Review of Systems :negative unless otherwise stated in HPI.      Physical Exam Triage Vital Signs ED Triage Vitals  Encounter Vitals Group     BP 07/08/23 0859 111/68     Girls Systolic BP Percentile --      Girls Diastolic BP Percentile --      Boys Systolic BP Percentile --      Boys Diastolic BP Percentile --      Pulse Rate 07/08/23 0859 77     Resp 07/08/23 0859 14     Temp 07/08/23 0859 98.3 F (36.8 C)     Temp Source 07/08/23 0859 Oral     SpO2 07/08/23 0859 100 %  Weight 07/08/23 0856 164 lb 14.5 oz (74.8 kg)     Height 07/08/23 0856 5' 6 (1.676 m)     Head Circumference --      Peak Flow --      Pain Score 07/08/23 0856 5     Pain Loc --      Pain Education --      Exclude from Growth Chart --    No data found.  Updated Vital Signs BP 111/68 (BP Location: Left Arm)   Pulse 77   Temp 98.3 F (36.8 C) (Oral)   Resp 14   Ht 5' 6 (1.676 m)   Wt 74.8 kg   LMP 06/25/2023 (Exact Date)   SpO2 100%   BMI 26.62 kg/m   Visual Acuity Right Eye Distance:   Left Eye Distance:   Bilateral Distance:    Right Eye Near:   Left Eye Near:    Bilateral Near:     Physical Exam  GEN: well appearing female, in no acute distress  CV: regular rate and rhythm RESP: no increased work of breathing, clear to ascultation bilaterally ABD: Bowel sounds present. Soft, non-tender, non-distended.  No guarding, no rebound, no appreciable hepatosplenomegaly SKIN: warm, dry, no rash  on visible skin NEURO: alert, moves all extremities appropriately  UC Treatments / Results  Labs (all labs ordered are listed, but only abnormal results are displayed) Labs Reviewed  GASTROINTESTINAL PANEL BY PCR, STOOL (REPLACES STOOL CULTURE)    EKG  If EKG performed, see my interpretation and MDM section  Radiology No results found.   Procedures Procedures (including critical care time)  Medications Ordered in UC Medications - No data to display  Initial Impression / Assessment and Plan / UC Course  I have reviewed the triage vital signs and the nursing notes.  Pertinent labs & imaging results that were available during my care of the patient were reviewed by me and considered in my medical decision making (see chart for details).       Patient is a  33 y.o. female who presents after having insidious  foul-smelling diarrhea that floats with cramping abdominal pain after exposure to Giardia from her cat. She has 6 cats.    Overall, patient is well-appearing, well-hydrated, and in no acute distress.  Vital signs stable.  Malaijah is afebrile.  Exam is not concerning for an acute abdomen. Ordered GI pathogen panel to check for infectious causes including Giardia. Treat empirically with Tinidazole given Giardia exposure.    Pt to return with stool sample then start antibiotics. Follow-up, return and ED precautions given. Discussed MDM, treatment plan and plan for follow-up with patient who agrees with plan.    Final Clinical Impressions(s) / UC Diagnoses   Final diagnoses:  Diarrhea of presumed infectious origin     Discharge Instructions      Return to the urgent care with your stool sample.  After returning your stool sample, take your antibiotics as prescribed for treatment of Giardia.      ED Prescriptions     Medication Sig Dispense Auth. Provider   tinidazole (TINDAMAX) 500 MG tablet Take 4 tablets (2,000 mg total) by mouth daily with breakfast for 1 dose.  4 tablet Sadee Osland, DO      PDMP not reviewed this encounter.   Braeden Dolinski, DO 07/08/23 1005

## 2023-07-09 ENCOUNTER — Telehealth: Payer: Self-pay | Admitting: Family Medicine

## 2023-07-09 LAB — GASTROINTESTINAL PANEL BY PCR, STOOL (REPLACES STOOL CULTURE)
Adenovirus F40/41: NOT DETECTED
Astrovirus: NOT DETECTED
Campylobacter species: NOT DETECTED
Cryptosporidium: NOT DETECTED
Cyclospora cayetanensis: NOT DETECTED
Entamoeba histolytica: NOT DETECTED
Enteroaggregative E coli (EAEC): DETECTED — AB
Enteropathogenic E coli (EPEC): DETECTED — AB
Enterotoxigenic E coli (ETEC): NOT DETECTED
Giardia lamblia: NOT DETECTED
Norovirus GI/GII: NOT DETECTED
Plesimonas shigelloides: NOT DETECTED
Rotavirus A: NOT DETECTED
Salmonella species: NOT DETECTED
Sapovirus (I, II, IV, and V): NOT DETECTED
Shiga like toxin producing E coli (STEC): NOT DETECTED
Shigella/Enteroinvasive E coli (EIEC): NOT DETECTED
Vibrio cholerae: NOT DETECTED
Vibrio species: NOT DETECTED
Yersinia enterocolitica: NOT DETECTED

## 2023-07-09 NOTE — Telephone Encounter (Signed)
 Called patient regarding positive GI Pathogen Panel results x2.  Left secure voicemail and sent MyChart message.   Your stool sample showed two kinds of E coli. Your Giardia test was negative. Stop the Tinidazole as this medication is not needed. The diarrhea should clear up without treatment in the next 7 days.  If you start having blood in your stool, seek medical attention. No antibiotics are needed at this time.    Be sure to stay hydrated. Monitor your urine color and amount.  Caprice Porteous, DO

## 2023-07-10 ENCOUNTER — Ambulatory Visit: Payer: Self-pay

## 2023-12-20 ENCOUNTER — Ambulatory Visit

## 2023-12-20 ENCOUNTER — Encounter: Payer: Self-pay | Admitting: Gastroenterology

## 2023-12-20 ENCOUNTER — Ambulatory Visit
Admission: RE | Admit: 2023-12-20 | Discharge: 2023-12-20 | Disposition: A | Attending: Gastroenterology | Admitting: Gastroenterology

## 2023-12-20 ENCOUNTER — Encounter: Admission: RE | Disposition: A | Payer: Self-pay | Source: Home / Self Care | Attending: Gastroenterology

## 2023-12-20 HISTORY — PX: ESOPHAGOGASTRODUODENOSCOPY: SHX5428

## 2023-12-20 LAB — POCT PREGNANCY, URINE: Preg Test, Ur: NEGATIVE

## 2023-12-20 SURGERY — EGD (ESOPHAGOGASTRODUODENOSCOPY)
Anesthesia: General

## 2023-12-20 MED ORDER — SODIUM CHLORIDE 0.9 % IV SOLN: INTRAVENOUS | Status: DC

## 2023-12-20 MED ORDER — LIDOCAINE HCL (PF) 2 % IJ SOLN
INTRAMUSCULAR | Status: DC | PRN
  Administered 2023-12-20: 100 mg via INTRADERMAL

## 2023-12-20 MED ORDER — PROPOFOL 500 MG/50ML IV EMUL
INTRAVENOUS | Status: DC | PRN
  Administered 2023-12-20: 150 mg via INTRAVENOUS
  Administered 2023-12-20: 200 ug/kg/min via INTRAVENOUS

## 2023-12-20 NOTE — Op Note (Signed)
 [facility] Patient Name: Wendy Mora Procedure Date: 12/20/2023 10:57 AM MRN: [id number] Account #: [id number] Date of Birth: 07-Oct-1990 Admit Type: Outpatient Age: 33 Room: [facility] ENDO ROOM 4 Gender: Female Note Status: Finalized Instrument Name: Upper GI Scope [id number] Procedure:             Upper GI endoscopy Indications:           Positive celiac serologies Providers:             Corinn Jess Brooklyn MD, MD Referring MD:          Corinn Jess Brooklyn MD, MD (Referring MD), Lavenia Beaver, MD (Referring MD) Medicines:             General Anesthesia Complications:         No immediate complications. Estimated blood loss: None. Procedure:             Pre-Anesthesia Assessment:                        - Prior to the procedure, a History and Physical was                         performed, and patient medications and allergies were                         reviewed. The patient is competent. The risks and                         benefits of the procedure and the sedation options and                         risks were discussed with the patient. All questions                         were answered and informed consent was obtained.                         Patient identification and proposed procedure were                         verified by the physician, the nurse, the                         anesthesiologist, the anesthetist and the technician                         in the pre-procedure area in the procedure room in the                         endoscopy suite. Mental Status Examination: alert and                         oriented. Airway Examination: normal oropharyngeal                         airway and neck mobility. Respiratory Examination:  clear to auscultation. CV Examination: normal.                         Prophylactic Antibiotics: The patient does not require                          prophylactic antibiotics. Prior Anticoagulants: The                         patient has taken no anticoagulant or antiplatelet                         agents. ASA Grade Assessment: II - A patient with mild                         systemic disease. After reviewing the risks and                         benefits, the patient was deemed in satisfactory                         condition to undergo the procedure. The anesthesia                         plan was to use general anesthesia. Immediately prior                         to administration of medications, the patient was                         re-assessed for adequacy to receive sedatives. The                         heart rate, respiratory rate, oxygen saturations,                         blood pressure, adequacy of pulmonary ventilation, and                         response to care were monitored throughout the                         procedure. The physical status of the patient was                         re-assessed after the procedure.                        After obtaining informed consent, the endoscope was                         passed under direct vision. Throughout the procedure,                         the patient's blood pressure, pulse, and oxygen                         saturations were monitored continuously. The Endoscope  was introduced through the mouth, and advanced to the                         second part of duodenum. The upper GI endoscopy was                         accomplished without difficulty. The patient tolerated                         the procedure well. Findings:      The duodenal bulb and second portion of the duodenum were normal.       Biopsies for histology were taken with a cold forceps for evaluation of       celiac disease.      The entire examined stomach was normal.      The cardia and gastric fundus were normal on retroflexion.      The gastroesophageal junction and  examined esophagus were normal. Impression:            - Normal duodenal bulb and second portion of the                         duodenum. Biopsied.                        - Normal stomach.                        - Normal gastroesophageal junction and esophagus. Recommendation:        - Discharge patient to home (with escort).                        - Resume previous diet today.                        - Continue present medications.                        - Await pathology results. Procedure Code(s):     --- Professional ---                        [phone], Esophagogastroduodenoscopy, flexible,                         transoral; with biopsy, single or multiple Diagnosis Code(s):     --- Professional ---                        R76.8, Other specified abnormal immunological findings                         in serum CPT copyright 2022 [employer]. All rights reserved. The codes documented in this report are preliminary and upon coder review may  be revised to meet current compliance requirements. Dr. Corinn Brooklyn Corinn Jess Brooklyn MD, MD 12/20/2023 11:11:13 AM This report has been signed electronically. Number of Addenda: 0 Note Initiated On: 12/20/2023 10:57 AM Estimated Blood Loss:  Estimated blood loss: none.      [facility]

## 2023-12-20 NOTE — H&P (Signed)
 Wendy JONELLE Brooklyn, MD [facility], [address]IP [address]  [place], [place] [address]  Main: [phone] Fax:  [phone] Pager: [phone]   Primary Care Physician:  Sherial Bail, MD Primary Gastroenterologist:  Dr. Corinn JONELLE Mora  Pre-Procedure History & Physical: HPI:  Wendy Mora is a 33 y.o. female is here for an endoscopy.   Past Medical History:  Diagnosis Date   Anemia    Anxiety    Hyperlipidemia     Past Surgical History:  Procedure Laterality Date   COLONOSCOPY WITH PROPOFOL  N/A 02/17/2020   Procedure: COLONOSCOPY WITH PROPOFOL ;  Surgeon: Mora Wendy Skiff, MD;  Location: [facility] ENDOSCOPY;  Service: Gastroenterology;  Laterality: N/A;   NO PAST SURGERIES      Prior to Admission medications   Medication Sig Start Date End Date Taking? Authorizing Provider  ferrous sulfate 325 (65 FE) MG EC tablet Take 325 mg by mouth daily with breakfast.   Yes [provider]    Allergies as of 12/15/2023 - Review Complete 07/08/2023  Allergen Reaction Noted   Mango flavoring agent (non-screening) Rash 12/09/2019    Family History  Problem Relation Age of Onset   Diabetes Mother    Depression Mother    Thyroid disease Mother    Hyperlipidemia Mother    Hypertension Mother    Heart disease Father    Alcohol abuse Father    Diabetes Brother    Mental retardation Brother    Cancer Maternal Grandmother    Cancer Maternal Grandfather    Alzheimer's disease Paternal Grandmother    Obesity Brother    Bipolar disorder Brother    High Cholesterol Sister     Social History   Socioeconomic History   Marital status: Single    Spouse name: Not on file   Number of children: Not on file   Years of education: Not on file   Highest education level: Not on file  Occupational History   Not on file  Tobacco Use   Smoking status: Never   Smokeless tobacco: Never  Vaping Use   Vaping status: Never Used  Substance and Sexual  Activity   Alcohol use: Yes    Comment: socially   Drug use: Never   Sexual activity: Not on file  Other Topics Concern   Not on file  Social History Narrative   Not on file   Social Drivers of Health   Financial Resource Strain: Low Risk  (12/01/2023)   Received from [facility]   Overall Financial Resource Strain (CARDIA)    Difficulty of Paying Living Expenses: Not hard at all  Food Insecurity: No Food Insecurity (12/01/2023)   Received from [facility]   Hunger Vital Sign    Within the past 12 months, you worried that your food would run out before you got the money to buy more.: Never true    Within the past 12 months, the food you bought just didn't last and you didn't have money to get more.: Never true  Transportation Needs: No Transportation Needs (12/01/2023)   Received [facility] - Transportation    In the past 12 months, has lack of transportation kept you from medical appointments or from getting medications?: No    Lack of Transportation (Non-Medical): No  Physical Activity: Not on file  Stress: Not on file  Social Connections: Not on file  Intimate Partner Violence: Not on file    Review  of Systems: See HPI, otherwise negative ROS  Physical Exam: BP 113/73   Pulse 80   Temp (!) 96.9 F (36.1 C) (Temporal)   Resp 20   Ht 5' 6 (1.676 m)   Wt 74.7 kg   SpO2 100%   BMI 26.57 kg/m  General:   Alert,  pleasant and cooperative in NAD Head:  Normocephalic and atraumatic. Neck:  Supple; no masses or thyromegaly. Lungs:  Clear throughout to auscultation.    Heart:  Regular rate and rhythm. Abdomen:  Soft, nontender and nondistended. Normal bowel sounds, without guarding, and without rebound.   Neurologic:  Alert and  oriented x4;  grossly normal neurologically.  Impression/Plan: Wendy Mora is here for an endoscopy to be performed for elevated celiac serologies  Risks, benefits,  limitations, and alternatives regarding  endoscopy have been reviewed with the patient.  Questions have been answered.  All parties agreeable.   Wendy Brooklyn, MD  12/20/2023, 11:01 AM

## 2023-12-20 NOTE — Anesthesia Preprocedure Evaluation (Addendum)
 Anesthesia Evaluation  Patient identified by MRN, date of birth, ID band Patient awake    Reviewed: Allergy & Precautions, H&P , NPO status , Patient's Chart, lab work & pertinent test results  Airway Mallampati: II  TM Distance: >3 FB Neck ROM: full    Dental no notable dental hx.    Pulmonary neg pulmonary ROS   Pulmonary exam normal        Cardiovascular negative cardio ROS Normal cardiovascular exam     Neuro/Psych  PSYCHIATRIC DISORDERS Anxiety     negative neurological ROS     GI/Hepatic negative GI ROS, Neg liver ROS,,,  Endo/Other  negative endocrine ROS    Renal/GU negative Renal ROS  negative genitourinary   Musculoskeletal   Abdominal Normal abdominal exam  (+)   Peds  Hematology  (+) Blood dyscrasia, anemia   Anesthesia Other Findings Past Medical History: No date: Anemia No date: Anxiety No date: Hyperlipidemia  Past Surgical History: 02/17/2020: COLONOSCOPY WITH PROPOFOL ; N/A     Comment:  Procedure: COLONOSCOPY WITH PROPOFOL ;  Surgeon: Unk Corinn Skiff, MD;  Location: [facility] ENDOSCOPY;  Service:               Gastroenterology;  Laterality: N/A; No date: NO PAST SURGERIES     Reproductive/Obstetrics negative OB ROS                              Anesthesia Physical Anesthesia Plan  ASA: 2  Anesthesia Plan: General   Post-op Pain Management:    Induction:   PONV Risk Score and Plan: Propofol  infusion and TIVA  Airway Management Planned:   Additional Equipment:   Intra-op Plan:   Post-operative Plan:   Informed Consent: I have reviewed the patients History and Physical, chart, labs and discussed the procedure including the risks, benefits and alternatives for the proposed anesthesia with the patient or authorized representative who has indicated his/her understanding and acceptance.     Dental Advisory Given  Plan Discussed with: CRNA  and Surgeon  Anesthesia Plan Comments:          Anesthesia Quick Evaluation

## 2023-12-20 NOTE — Anesthesia Postprocedure Evaluation (Signed)
 Anesthesia Post Note  Patient: Wendy Mora  Procedure(s) Performed: EGD (ESOPHAGOGASTRODUODENOSCOPY)  Patient location during evaluation: Endoscopy Anesthesia Type: General Level of consciousness: awake and alert Pain management: pain level controlled Vital Signs Assessment: post-procedure vital signs reviewed and stable Respiratory status: spontaneous breathing, nonlabored ventilation, respiratory function stable and patient connected to nasal cannula oxygen Cardiovascular status: blood pressure returned to baseline and stable Postop Assessment: no apparent nausea or vomiting Anesthetic complications: no   There were no known notable events for this encounter.   Last Vitals:  Vitals:   12/20/23 1130 12/20/23 1135  BP: 111/65 102/70  Pulse: 76 64  Resp: 14 14  Temp: (!) 36.1 C (!) 36.1 C  SpO2: 100% 100%    Last Pain:  Vitals:   12/20/23 1135  TempSrc: Temporal  PainSc: 0-No pain                 Debby Mines

## 2023-12-20 NOTE — Transfer of Care (Signed)
 Immediate Anesthesia Transfer of Care Note  Patient: Wendy Mora  Procedure(s) Performed: EGD (ESOPHAGOGASTRODUODENOSCOPY)  Patient Location: Endoscopy Unit  Anesthesia Type:General  Level of Consciousness: awake  Airway & Oxygen Therapy: Patient Spontanous Breathing  Post-op Assessment: Report given to RN and Post -op Vital signs reviewed and stable  Post vital signs: Reviewed and stable  Last Vitals:  Vitals Value Taken Time  BP 106/70 12/20/23 11:14  Temp    Pulse 95 12/20/23 11:14  Resp 20 12/20/23 11:15  SpO2 97 % 12/20/23 11:14  Vitals shown include unfiled device data.  Last Pain:  Vitals:   12/20/23 0957  TempSrc: Temporal  PainSc: 0-No pain         Complications: No notable events documented.

## 2023-12-22 LAB — SURGICAL PATHOLOGY

## 2023-12-25 ENCOUNTER — Ambulatory Visit: Payer: Self-pay | Admitting: Gastroenterology
